# Patient Record
Sex: Female | Born: 1945 | Race: Black or African American | Hispanic: No | Marital: Single | Smoking: Former smoker
Health system: Southern US, Community
[De-identification: ages and names within clinical notes are randomized; demographics above are authoritative.]

## PROBLEM LIST (undated history)

## (undated) DIAGNOSIS — Z9289 Personal history of other medical treatment: Secondary | ICD-10-CM

## (undated) DIAGNOSIS — M1990 Unspecified osteoarthritis, unspecified site: Secondary | ICD-10-CM

## (undated) DIAGNOSIS — C801 Malignant (primary) neoplasm, unspecified: Secondary | ICD-10-CM

## (undated) DIAGNOSIS — G4700 Insomnia, unspecified: Secondary | ICD-10-CM

## (undated) DIAGNOSIS — M810 Age-related osteoporosis without current pathological fracture: Secondary | ICD-10-CM

## (undated) DIAGNOSIS — E039 Hypothyroidism, unspecified: Secondary | ICD-10-CM

## (undated) DIAGNOSIS — M549 Dorsalgia, unspecified: Secondary | ICD-10-CM

## (undated) DIAGNOSIS — I8390 Asymptomatic varicose veins of unspecified lower extremity: Secondary | ICD-10-CM

## (undated) DIAGNOSIS — K219 Gastro-esophageal reflux disease without esophagitis: Secondary | ICD-10-CM

## (undated) DIAGNOSIS — K5900 Constipation, unspecified: Secondary | ICD-10-CM

## (undated) DIAGNOSIS — E079 Disorder of thyroid, unspecified: Secondary | ICD-10-CM

## (undated) DIAGNOSIS — R232 Flushing: Secondary | ICD-10-CM

## (undated) HISTORY — PX: ABDOMINAL HYSTERECTOMY: SHX81

## (undated) HISTORY — DX: Disorder of thyroid, unspecified: E07.9

## (undated) HISTORY — DX: Age-related osteoporosis without current pathological fracture: M81.0

## (undated) HISTORY — DX: Flushing: R23.2

## (undated) HISTORY — PX: KNEE ARTHROSCOPY: SUR90

## (undated) HISTORY — PX: BREAST SURGERY: SHX581

## (undated) HISTORY — DX: Asymptomatic varicose veins of unspecified lower extremity: I83.90

## (undated) HISTORY — DX: Dorsalgia, unspecified: M54.9

## (undated) HISTORY — PX: BUNIONECTOMY: SHX129

## (undated) HISTORY — DX: Unspecified osteoarthritis, unspecified site: M19.90

## (undated) HISTORY — DX: Malignant (primary) neoplasm, unspecified: C80.1

## (undated) HISTORY — PX: VARICOSE VEIN SURGERY: SHX832

## (undated) HISTORY — PX: TUBAL LIGATION: SHX77

---

## 1978-10-16 DIAGNOSIS — Z862 Personal history of diseases of the blood and blood-forming organs and certain disorders involving the immune mechanism: Secondary | ICD-10-CM | POA: Insufficient documentation

## 1978-10-16 DIAGNOSIS — Z8639 Personal history of other endocrine, nutritional and metabolic disease: Secondary | ICD-10-CM | POA: Insufficient documentation

## 1988-10-16 DIAGNOSIS — E018 Other iodine-deficiency related thyroid disorders and allied conditions: Secondary | ICD-10-CM | POA: Insufficient documentation

## 2000-07-05 ENCOUNTER — Encounter: Admission: RE | Admit: 2000-07-05 | Discharge: 2000-07-05 | Payer: Self-pay | Admitting: Internal Medicine

## 2000-07-05 ENCOUNTER — Encounter: Payer: Self-pay | Admitting: Internal Medicine

## 2002-06-30 ENCOUNTER — Other Ambulatory Visit: Admission: RE | Admit: 2002-06-30 | Discharge: 2002-06-30 | Payer: Self-pay | Admitting: Obstetrics and Gynecology

## 2003-06-27 ENCOUNTER — Emergency Department (HOSPITAL_COMMUNITY): Admission: EM | Admit: 2003-06-27 | Discharge: 2003-06-27 | Payer: Self-pay | Admitting: Emergency Medicine

## 2003-08-11 ENCOUNTER — Other Ambulatory Visit: Admission: RE | Admit: 2003-08-11 | Discharge: 2003-08-11 | Payer: Self-pay | Admitting: Internal Medicine

## 2004-10-20 ENCOUNTER — Other Ambulatory Visit: Admission: RE | Admit: 2004-10-20 | Discharge: 2004-10-20 | Payer: Self-pay | Admitting: Internal Medicine

## 2007-06-14 ENCOUNTER — Other Ambulatory Visit: Admission: RE | Admit: 2007-06-14 | Discharge: 2007-06-14 | Payer: Self-pay | Admitting: Internal Medicine

## 2008-10-19 ENCOUNTER — Ambulatory Visit: Payer: Self-pay | Admitting: Internal Medicine

## 2008-10-29 ENCOUNTER — Ambulatory Visit: Payer: Self-pay | Admitting: Internal Medicine

## 2008-10-29 DIAGNOSIS — M545 Low back pain: Secondary | ICD-10-CM

## 2008-10-29 LAB — CONVERTED CEMR LAB: TSH: 1.18 microintl units/mL (ref 0.350–4.50)

## 2008-11-08 ENCOUNTER — Encounter (INDEPENDENT_AMBULATORY_CARE_PROVIDER_SITE_OTHER): Payer: Self-pay | Admitting: Internal Medicine

## 2009-02-26 ENCOUNTER — Ambulatory Visit: Payer: Self-pay | Admitting: Internal Medicine

## 2009-02-26 DIAGNOSIS — M25569 Pain in unspecified knee: Secondary | ICD-10-CM | POA: Insufficient documentation

## 2009-02-26 DIAGNOSIS — M75101 Unspecified rotator cuff tear or rupture of right shoulder, not specified as traumatic: Secondary | ICD-10-CM | POA: Insufficient documentation

## 2009-02-26 LAB — CONVERTED CEMR LAB
ALT: 16 units/L (ref 0–35)
AST: 25 units/L (ref 0–37)
Albumin: 4.1 g/dL (ref 3.5–5.2)
Alkaline Phosphatase: 123 units/L — ABNORMAL HIGH (ref 39–117)
BUN: 8 mg/dL (ref 6–23)
Basophils Absolute: 0 10*3/uL (ref 0.0–0.1)
Basophils Relative: 1 % (ref 0–1)
Bilirubin Urine: NEGATIVE
Blood in Urine, dipstick: NEGATIVE
CO2: 26 meq/L (ref 19–32)
Calcium: 9.1 mg/dL (ref 8.4–10.5)
Chloride: 103 meq/L (ref 96–112)
Cholesterol: 142 mg/dL (ref 0–200)
Creatinine, Ser: 0.76 mg/dL (ref 0.40–1.20)
Eosinophils Absolute: 0.1 10*3/uL (ref 0.0–0.7)
Eosinophils Relative: 2 % (ref 0–5)
Glucose, Bld: 75 mg/dL (ref 70–99)
Glucose, Urine, Semiquant: NEGATIVE
HCT: 41 % (ref 36.0–46.0)
HDL: 44 mg/dL (ref >39)
Hemoglobin: 13.6 g/dL (ref 12.0–15.0)
Ketones, urine, test strip: NEGATIVE
LDL Cholesterol: 82 mg/dL (ref 0–99)
Lymphocytes Relative: 36 % (ref 12–46)
Lymphs Abs: 1.7 10*3/uL (ref 0.7–4.0)
MCHC: 33.2 g/dL (ref 30.0–36.0)
MCV: 87.6 fL (ref 78.0–100.0)
Monocytes Absolute: 0.4 10*3/uL (ref 0.1–1.0)
Monocytes Relative: 9 % (ref 3–12)
Neutro Abs: 2.5 10*3/uL (ref 1.7–7.7)
Neutrophils Relative %: 53 % (ref 43–77)
Nitrite: NEGATIVE
Platelets: 221 10*3/uL (ref 150–400)
Potassium: 4.2 meq/L (ref 3.5–5.3)
Protein, U semiquant: NEGATIVE
RBC: 4.68 M/uL (ref 3.87–5.11)
RDW: 14.3 % (ref 11.5–15.5)
Sodium: 141 meq/L (ref 135–145)
Specific Gravity, Urine: 1.015
Total Bilirubin: 0.6 mg/dL (ref 0.3–1.2)
Total CHOL/HDL Ratio: 3.2
Total Protein: 6.9 g/dL (ref 6.0–8.3)
Triglycerides: 80 mg/dL (ref <150)
Urobilinogen, UA: 0.2
VLDL: 16 mg/dL (ref 0–40)
WBC Urine, dipstick: NEGATIVE
WBC: 4.6 10*3/uL (ref 4.0–10.5)
pH: 7

## 2009-02-28 ENCOUNTER — Encounter (INDEPENDENT_AMBULATORY_CARE_PROVIDER_SITE_OTHER): Payer: Self-pay | Admitting: Internal Medicine

## 2012-11-11 ENCOUNTER — Other Ambulatory Visit: Payer: Self-pay | Admitting: General Practice

## 2012-11-11 DIAGNOSIS — N6310 Unspecified lump in the right breast, unspecified quadrant: Secondary | ICD-10-CM

## 2012-11-21 ENCOUNTER — Ambulatory Visit
Admission: RE | Admit: 2012-11-21 | Discharge: 2012-11-21 | Disposition: A | Discharge status: Home / Self Care | Payer: Medicaid Other | Source: Ambulatory Visit | Attending: General Practice | Admitting: General Practice

## 2012-11-21 ENCOUNTER — Other Ambulatory Visit: Payer: Self-pay | Admitting: General Practice

## 2012-11-21 DIAGNOSIS — N6310 Unspecified lump in the right breast, unspecified quadrant: Secondary | ICD-10-CM

## 2012-11-25 ENCOUNTER — Other Ambulatory Visit: Payer: Self-pay | Admitting: General Practice

## 2012-11-25 ENCOUNTER — Ambulatory Visit
Admission: RE | Admit: 2012-11-25 | Discharge: 2012-11-25 | Disposition: A | Discharge status: Home / Self Care | Payer: Medicaid Other | Source: Ambulatory Visit | Attending: General Practice | Admitting: General Practice

## 2012-11-25 ENCOUNTER — Inpatient Hospital Stay: Admission: RE | Admit: 2012-11-25 | Payer: Medicaid Other | Source: Ambulatory Visit

## 2012-11-25 DIAGNOSIS — N6310 Unspecified lump in the right breast, unspecified quadrant: Secondary | ICD-10-CM

## 2012-11-26 ENCOUNTER — Telehealth: Payer: Self-pay | Admitting: *Deleted

## 2012-11-26 ENCOUNTER — Other Ambulatory Visit: Payer: Self-pay | Admitting: General Practice

## 2012-11-26 DIAGNOSIS — C50419 Malignant neoplasm of upper-outer quadrant of unspecified female breast: Secondary | ICD-10-CM | POA: Insufficient documentation

## 2012-11-26 DIAGNOSIS — C50411 Malignant neoplasm of upper-outer quadrant of right female breast: Secondary | ICD-10-CM

## 2012-11-26 DIAGNOSIS — C50911 Malignant neoplasm of unspecified site of right female breast: Secondary | ICD-10-CM

## 2012-11-26 NOTE — Telephone Encounter (Signed)
Confirmed [facility] for 12/04/12 at 0800 .  Instructions and contact information given. 

## 2012-11-29 ENCOUNTER — Other Ambulatory Visit: Payer: Medicaid Other

## 2012-12-04 ENCOUNTER — Telehealth: Payer: Self-pay | Admitting: Oncology

## 2012-12-04 ENCOUNTER — Ambulatory Visit (HOSPITAL_BASED_OUTPATIENT_CLINIC_OR_DEPARTMENT_OTHER): Payer: Medicaid Other | Admitting: General Surgery

## 2012-12-04 ENCOUNTER — Ambulatory Visit: Payer: Medicaid Other

## 2012-12-04 ENCOUNTER — Encounter (INDEPENDENT_AMBULATORY_CARE_PROVIDER_SITE_OTHER): Payer: Self-pay | Admitting: General Surgery

## 2012-12-04 ENCOUNTER — Ambulatory Visit
Admission: RE | Admit: 2012-12-04 | Discharge: 2012-12-04 | Disposition: A | Discharge status: Home / Self Care | Payer: Medicaid Other | Source: Ambulatory Visit | Attending: Radiation Oncology | Admitting: Radiation Oncology

## 2012-12-04 ENCOUNTER — Encounter: Payer: Self-pay | Admitting: Oncology

## 2012-12-04 ENCOUNTER — Ambulatory Visit: Payer: Medicaid Other | Attending: General Surgery | Admitting: Physical Therapy

## 2012-12-04 ENCOUNTER — Other Ambulatory Visit: Payer: Medicaid Other | Admitting: Lab

## 2012-12-04 ENCOUNTER — Ambulatory Visit (HOSPITAL_BASED_OUTPATIENT_CLINIC_OR_DEPARTMENT_OTHER): Payer: Medicaid Other | Admitting: Oncology

## 2012-12-04 VITALS — BP 136/84 | HR 92 | Temp 98.4°F | Resp 20 | Ht 65.5 in | Wt 213.3 lb

## 2012-12-04 DIAGNOSIS — M25619 Stiffness of unspecified shoulder, not elsewhere classified: Secondary | ICD-10-CM | POA: Insufficient documentation

## 2012-12-04 DIAGNOSIS — Z170 Estrogen receptor positive status [ER+]: Secondary | ICD-10-CM

## 2012-12-04 DIAGNOSIS — R293 Abnormal posture: Secondary | ICD-10-CM | POA: Insufficient documentation

## 2012-12-04 DIAGNOSIS — C50919 Malignant neoplasm of unspecified site of unspecified female breast: Secondary | ICD-10-CM | POA: Insufficient documentation

## 2012-12-04 DIAGNOSIS — D0590 Unspecified type of carcinoma in situ of unspecified breast: Secondary | ICD-10-CM

## 2012-12-04 DIAGNOSIS — IMO0001 Reserved for inherently not codable concepts without codable children: Secondary | ICD-10-CM | POA: Insufficient documentation

## 2012-12-04 LAB — COMPREHENSIVE METABOLIC PANEL (CC13)
Alkaline Phosphatase: 147 U/L (ref 40–150)
BUN: 11.6 mg/dL (ref 7.0–26.0)
CO2: 27 mEq/L (ref 22–29)
Creatinine: 0.8 mg/dL (ref 0.6–1.1)
Glucose: 99 mg/dl (ref 70–99)
Sodium: 143 mEq/L (ref 136–145)
Total Bilirubin: 0.43 mg/dL (ref 0.20–1.20)
Total Protein: 7.4 g/dL (ref 6.4–8.3)

## 2012-12-04 LAB — CBC WITH DIFFERENTIAL/PLATELET
Basophils Absolute: 0.1 10*3/uL (ref 0.0–0.1)
Eosinophils Absolute: 0.1 10*3/uL (ref 0.0–0.5)
HCT: 42.4 % (ref 34.8–46.6)
HGB: 14.3 g/dL (ref 11.6–15.9)
LYMPH%: 31.7 % (ref 14.0–49.7)
MCV: 90.6 fL (ref 79.5–101.0)
MONO#: 0.6 10*3/uL (ref 0.1–0.9)
MONO%: 11.1 % (ref 0.0–14.0)
NEUT#: 3 10*3/uL (ref 1.5–6.5)
NEUT%: 53.7 % (ref 38.4–76.8)
Platelets: 175 10*3/uL (ref 145–400)
WBC: 5.6 10*3/uL (ref 3.9–10.3)

## 2012-12-04 NOTE — Progress Notes (Signed)
Patient ID: Lauren Farley, female   DOB: April 23, 1946, 67 y.o.   MRN: [id number]  No chief complaint on file.   HPI Lauren Farley is a 67 y.o. female.   HPI  She is referred by Dr. Judyann Munson for newly diagnosed DCIS of right breast.  Approximately 2 months ago, she noted a lump in the right breast underneath the nipple at the 9:00 position. A mammogram was performed which demonstrated the lesion. An image guided biopsy was done. 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 It was hormone receptor positive. Proliferation marker was 6%.  No family history of breast cancer. She is unsure of the age at menarche. No hormone replacement therapy. She had a hysterectomy in the 1980s. She's had no pain from the mass. No nipple discharge.  Past Medical History  Diagnosis Date  . Arthritis   . Back pain   . Hot flashes   . Cancer     Breast  . Thyroid disease     Graves disease s/p RAI treatment    Past Surgical History  Procedure Laterality Date  . Bunionectomy Bilateral   . Abdominal hysterectomy      TAH/BSO    History reviewed. No pertinent family history.  Social History History  Substance Use Topics  . Smoking status: Former Games developer  . Smokeless tobacco: Not on file  . Alcohol Use: Yes    Not on File  No current outpatient prescriptions on file.   No current facility-administered medications for this visit.    Review of Systems Review of Systems  Constitutional: Positive for fatigue.  HENT: Negative.   Eyes: Negative.   Respiratory: Negative.   Cardiovascular: Negative.   Gastrointestinal: Negative.   Genitourinary: Negative.   Musculoskeletal: Positive for back pain.  Skin: Negative.   Hematological: Negative.     There were no vitals taken for this visit.  Physical Exam Physical Exam  Constitutional:  Obese female.  HENT:  Head: Normocephalic and atraumatic.  Eyes:  EOM are normal. No scleral icterus.  Left exophthalmos  Neck: Neck supple.  Cardiovascular: Normal rate and regular rhythm.   Pulmonary/Chest: Effort normal and breath sounds normal.  Right breast-palpable 2-2.5 cm mass at the 9:00 position of the nipple areolar complex.  Left breast-no palpable mass or suspicious skin change.  No palpable axillary or supraclavicular adenopathy  Abdominal: Soft. She exhibits no mass. There is no tenderness.  Obese. Lower midline scar.  Musculoskeletal: Normal range of motion. She exhibits no edema.  Lymphadenopathy:    She has no cervical adenopathy.  Neurological: She is alert.  Skin: Skin is warm and dry.    Data Reviewed Mammogram. Pathology report.  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 We discussed breast conservation therapy versus mastectomy. She wants bilateral mastectomies. She does not want reconstruction.    Plan    Cancel MRI. We'll plan on bilateral mastectomies with right axillary sentinel lymph node biopsy.  I have explained the procedure, risks, and aftercare to her.  Risks include but are not limited to bleeding, infection, wound problems, anesthesia, chronic chest wall pain, nerve injury, seroma formation, lymphedema.  She seems to understand and agrees with the plan.        Edmond Ginsberg J 12/04/2012, 11:26 AM

## 2012-12-04 NOTE — Progress Notes (Signed)
Lauren Farley [id number] Nov 20, 1945 67 y.o. 12/04/2012 10:57 AM  CC  Lauren Manson, MD [address] [place] [address] Dr. Teresa Coombs Dr. Lurline Hare  REASON FOR CONSULTATION:  67 year old female with new diagnosis of small DCIS with possible microinvasion of the right breast. Patient was seen in the Multidisciplinary Breast Clinic for discussion of her treatment options.  STAGE:   Cancer of upper-outer quadrant of female breast   Primary site: Breast (Right)   Staging method: AJCC 7th Edition   Clinical: Stage 0 (Tis (DCIS), N0, cM0)   Summary: Stage 0 (Tis (DCIS), N0, cM0)  REFERRING PHYSICIAN: Dr. Avel Peace  HISTORY OF PRESENT ILLNESS:  Lauren Farley is a 67 y.o. female.  With newly diagnosed DCIS of the right breast. About 2 months ago patient noted a mass in the right breast underneath the nipple at the 9:00 position. Mammogram demonstrated the lesion. 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 The tumor was ER positive PR positive with a proliferation marker Ki-67 6%. Patient's case was discussed in the multidisciplinary breast conference. Her radiology and pathology were reviewed.   Past Medical History: Past Medical History  Diagnosis Date  . Arthritis   . Back pain   . Hot flashes   . Cancer     Breast  . Thyroid disease     Graves disease s/p RAI treatment    Past Surgical History: Past Surgical History  Procedure Laterality Date  . Bunionectomy Bilateral   . Abdominal hysterectomy      TAH/BSO    Family History: No family history on file.  Social History History  Substance Use Topics  . Smoking status: Former Games developer  . Smokeless tobacco: Not on file  . Alcohol Use: Yes    Allergies: Not on File  Current Medications: No current outpatient prescriptions on file.   No current  facility-administered medications for this visit.    OB/GYN History:patient had menarche many years ago she underwent menopause in the 61s she had been on hormone replacement therapy only for about 2 months. She's had 4 term pregnancies first live birth at 49.  Fertility Discussion:not applicable Prior History of Cancer:no  Health Maintenance:  Colonoscopy no Bone Density 2005 Last PAP smear unknown  ECOG PERFORMANCE STATUS: 0 - Asymptomatic  Genetic Counseling/testing:not recommended at this time  REVIEW OF SYSTEMS:  A comprehensive review of systems was negative.  PHYSICAL EXAMINATION: Blood pressure 136/84, pulse 92, temperature 98.4 F (36.9 C), temperature source Oral, resp. rate 20, height 5' 5.5" (1.664 m), weight 213 lb 4.8 oz (96.752 kg).  XBJ:YNWGN, healthy, no distress, well nourished and well developed SKIN: skin color, texture, turgor are normal HEAD: Normocephalic EYES: PERRLA, EOMI EARS: External ears normal OROPHARYNX:no exudate and no erythema  NECK: supple, no adenopathy LYMPH:  no palpable lymphadenopathy, no hepatosplenomegaly BREAST:left breast normal without mass, skin or nipple changes or axillary nodes, abnormal mass palpable very small area around LUNGS: clear to auscultation  HEART: regular rate & rhythm ABDOMEN:abdomen soft, non-tender, normal bowel sounds and no masses or organomegaly BACK: No CVA tenderness EXTREMITIES:no edema  NEURO: alert & oriented x 3 with fluent speech, no focal motor/sensory deficits, gait normal     STUDIES/RESULTS: US Breast Right  11/21/2012  *RADIOLOGY REPORT*  Clinical Data:  Palpable right breast abnormality.  DIGITAL DIAGNOSTIC BILATERAL MAMMOGRAM WITH CAD AND RIGHT BREAST ULTRASOUND:  Comparison:  Prior mammograms dated 05/17/2007 and 08/18/2004 from [facility]  Findings:  ACR Breast Density Category 2: There is a scattered fibroglandular pattern.  There is a developing 11 mm mass in the lateral  aspect of the right breast in the anterior third.  There is a stable, well- circumscribed lesion and an adjacent lymph node in the middle third of the upper outer quadrant of the right breast.  The left breast is negative.  Mammographic images were processed with CAD.  On physical exam, I palpate a discrete mass in the right breast at 9 o'clock 1 cm from the nipple.  Ultrasound is performed, showing there is a hypoechoic mass in the right breast at 9 o'clock 1 cm from the nipple measuring 1.1 x 0.7 x 1.0 cm.  It appears to have ductal extension.  It is concerning. Adjacent to this is a smaller near anechoic lesion measuring 3 x 3 x 3 mm likely a cyst.  There is an ovoid well-circumscribed mass in the right breast at 9 o'clock 8 cm from the nipple measuring 1.0 x 0.6 x 1.2 cm consistent with the benign appearing lesion on the mammogram.  At 9 o'clock 7 cm from the nipple there is an intramammary lymph node measuring 0.6 x 0.3 x 0.7 cm. This is stable mammographically.  IMPRESSION: Suspicious mass in the 9 o'clock region of the right breast 1 cm from the nipple.  RECOMMENDATION: Ultrasound guided core biopsy of the right breast will be performed and dictated separately.  I have discussed the findings and recommendations with the patient. Results were also provided in writing at the conclusion of the visit.  BI-RADS CATEGORY 4:  Suspicious abnormality - biopsy should be considered.   Original Report Authenticated By: Baird Lyons, M.D.    Mm Digital Diagnostic Bilat  11/21/2012  *RADIOLOGY REPORT*  Clinical Data:  Palpable right breast abnormality.  DIGITAL DIAGNOSTIC BILATERAL MAMMOGRAM WITH CAD AND RIGHT BREAST ULTRASOUND:  Comparison:  Prior mammograms dated 05/17/2007 and 08/18/2004 from [facility]  Findings:  ACR Breast Density Category 2: There is a scattered fibroglandular pattern.  There is a developing 11 mm mass in the lateral aspect of the right breast in the anterior third.  There is a stable,  well- circumscribed lesion and an adjacent lymph node in the middle third of the upper outer quadrant of the right breast.  The left breast is negative.  Mammographic images were processed with CAD.  On physical exam, I palpate a discrete mass in the right breast at 9 o'clock 1 cm from the nipple.  Ultrasound is performed, showing there is a hypoechoic mass in the right breast at 9 o'clock 1 cm from the nipple measuring 1.1 x 0.7 x 1.0 cm.  It appears to have ductal extension.  It is concerning. Adjacent to this is a smaller near anechoic lesion measuring 3 x 3 x 3 mm likely a cyst.  There is an ovoid well-circumscribed mass in the right breast at 9 o'clock 8 cm from the nipple measuring 1.0 x 0.6 x 1.2 cm consistent with the benign appearing lesion on the mammogram.  At 9 o'clock 7 cm from the nipple there is an intramammary lymph node measuring 0.6 x 0.3 x 0.7 cm. This is stable mammographically.  IMPRESSION: Suspicious mass in the 9 o'clock region of the right breast 1 cm from the nipple.  RECOMMENDATION: Ultrasound guided core biopsy of the right breast will be performed and dictated separately.  I have discussed  the findings and recommendations with the patient. Results were also provided in writing at the conclusion of the visit.  BI-RADS CATEGORY 4:  Suspicious abnormality - biopsy should be considered.   Original Report Authenticated By: Baird Lyons, M.D.    Mm Radiologist Eval And Mgmt  11/25/2012  *RADIOLOGY REPORT*  ESTABLISHED PATIENT OFFICE VISIT - LEVEL II [phone])  Chief Complaint:  The patient presented a palpable abnormality in the right breast.  Ultrasound-guided core biopsy was performed. The patient returns for wound site check and results.  History:  Status post ultrasound-guided core biopsy of the right breast.  Exam:  The wound site is clean and dry with no signs of hematoma or infection.  There is some ecchymosis at the biopsy site.  Pathology: Ductal carcinoma with papillary features was  reported histologically.  This corresponds well with the imaging findings. The patient was made aware of the findings.  Assessment and Plan:  The patient is scheduled to be seen at the Multidisciplinary Clinic on 12/04/2012.  Breast MRI will be arranged at the patient's convenience.   Original Report Authenticated By: Baird Lyons, M.D.    [place] Rt Breast Bx W Loc Dev 1st Lesion Img Bx Spec US Guide  11/21/2012  *RADIOLOGY REPORT*  Clinical Data:  Suspicious right breast mass  ULTRASOUND GUIDED VACUUM ASSISTED CORE BIOPSY OF THE RIGHT BREAST  Comparison: Previous exams.  I met with the patient and we discussed the procedure of ultrasound- guided biopsy, including benefits and alternatives.  We discussed the high likelihood of a successful procedure. We discussed the risks of the procedure including infection, bleeding, tissue injury, clip migration, and inadequate sampling.  Informed written consent was given.  Using sterile technique, 2% lidocaine ultrasound guidance and a 12 gauge vacuum assisted needle biopsy was performed of a mass in the 9 o'clock region of the right breast 1 cm from the nipple usingan inferior approach.  I discussed the importance of clip placement following a biopsy.  The patient refused a marker clip, therefore not deployed.  IMPRESSION: Ultrasound-guided biopsy of the right breast.  No apparent complications.   Original Report Authenticated By: Baird Lyons, M.D.      LABS:    Chemistry      Component Value Date/Time   NA 143 12/04/2012 0826   NA 141 02/26/2009 2217   K 4.1 12/04/2012 0826   K 4.2 02/26/2009 2217   CL 106 12/04/2012 0826   CL 103 02/26/2009 2217   CO2 27 12/04/2012 0826   CO2 26 02/26/2009 2217   BUN 11.6 12/04/2012 0826   BUN 8 02/26/2009 2217   CREATININE 0.8 12/04/2012 0826   CREATININE 0.76 02/26/2009 2217      Component Value Date/Time   CALCIUM 9.8 12/04/2012 0826   CALCIUM 9.1 02/26/2009 2217   ALKPHOS 147 12/04/2012 0826   ALKPHOS 123* 02/26/2009 2217   AST 16  12/04/2012 0826   AST 25 02/26/2009 2217   ALT 16 12/04/2012 0826   ALT 16 02/26/2009 2217   BILITOT 0.43 12/04/2012 0826   BILITOT 0.6 02/26/2009 2217      Lab Results  Component Value Date   WBC 5.6 12/04/2012   HGB 14.3 12/04/2012   HCT 42.4 12/04/2012   MCV 90.6 12/04/2012   PLT 175 12/04/2012   PATHOLOGY: ADDITIONAL INFORMATION: PROGNOSTIC INDICATORS - ACIS Results IMMUNOHISTOCHEMICAL AND MORPHOMETRIC ANALYSIS BY THE AUTOMATED CELLULAR IMAGING SYSTEM (ACIS) Estrogen Receptor (Negative, <1%): 100%, STRONG STAINING INTENSITY Progesterone Receptor (Negative, <1%): 100%, STRONG  STAINING INTENSITY Proliferation Marker Ki67 by M IB-1 (Low<20%): 6% All controls stained appropriately Lauren Leisure MD Pathologist, Electronic Signature ( Signed 11/27/2012) CHROMOGENIC IN-SITU HYBRIDIZATION Interpretation HER-2/NEU BY CISH - NO AMPLIFICATION OF HER-2 DETECTED. THE RATIO OF HER-2: CEP 17 SIGNALS WAS 1.37. Reference range: Ratio: HER2:CEP17 < 1.8 - gene amplification not observed Ratio: HER2:CEP 17 1.8-2.2 - equivocal result Ratio: HER2:CEP17 > 2.2 - gene amplification observed Lauren Leisure MD Pathologist, Electronic Signature ( Signed 11/27/2012) FINAL DIAGNOSIS 1 of 3 FINAL for Lauren Farley, Lauren ([id number]) Diagnosis Breast, right, needle core biopsy, 9 o'clock - DUCTAL CARCINOMA WITH PAPILLARY FEATURES. SEE COMMENT. Microscopic Comment The lack of p63 staining raises the possibility of a broadly invasive ductal carcinoma with papillary features. Dr. Colonel Bald has reviewed this case and agrees. Call to [facility] on 11/22/12 and 11/25/12. Breast prognostic profile will be performed. (JDP:eps 11/25/12) Lauren Picket MD Pathologist, Electronic Signature (Case signed 11/25/2012) Specimen Gross and Clinical Information ASSESSMENT    67 year old female with new diagnosis of ductal carcinoma in situ of the right breast found on self breast examination. The tumor is ER positive PR  positive. She is clinical stage 0. Patient has opted for bilateral mastectomies without reconstruction. We discussed extensively the possible role of lumpectomy however she did not want to hear this. She is very determined to have mastectomies performed that she does not want to have to deal with this disease again. She did meet with Dr. Abbey Chatters  as well as Dr. Michell Heinrich.  Clinical Trial Eligibility: no Multidisciplinary conference discussion yes     PLAN:    Patient will be called by Dr. Maurie Boettcher office regarding surgical date and instructions. I will plan on seeing her back after the surgery to discuss the possible role of antiestrogen therapy.        Discussion: Patient is being treated per NCCN breast cancer care guidelines appropriate for stage.0, however the patient has opted for bilateral mastectomies.   Thank you so much for allowing me to participate in the care of Lauren Farley. I will continue to follow up the patient with you and assist in her care.  All questions were answered. The patient knows to call the clinic with any problems, questions or concerns. We can certainly see the patient much sooner if necessary.  I spent 55 minutes counseling the patient face to face. The total time spent in the appointment was 55 minutes.   Drue Second, MD Medical/Oncology [facility] [phone] (beeper) [phone] (Office)  12/04/2012, 10:57 AM

## 2012-12-04 NOTE — Progress Notes (Signed)
Lauren Farley has chosen bilateral mastectomies. She has a small area of DCIS with possible microinvasion.  She will likely be cured with surgery alone. I do not see a role for adjuvant radiation. I will be happy to see her back if she has tumor size greater than 5 cm or positive lymph nodes.

## 2012-12-04 NOTE — Telephone Encounter (Signed)
gv pt appt schedule for May. °

## 2012-12-04 NOTE — Patient Instructions (Signed)
My office will call you to schedule the surgery. 

## 2012-12-04 NOTE — Patient Instructions (Addendum)
Proceed with bilateral mastectomy per patient wishes

## 2012-12-04 NOTE — Progress Notes (Signed)
Checked in new patient. No financial issues. She did have her [facility] packet.

## 2012-12-06 ENCOUNTER — Other Ambulatory Visit: Payer: Medicaid Other

## 2012-12-09 ENCOUNTER — Encounter: Payer: Self-pay | Admitting: *Deleted

## 2012-12-09 ENCOUNTER — Telehealth: Payer: Self-pay | Admitting: *Deleted

## 2012-12-09 NOTE — Telephone Encounter (Signed)
Spoke to pt concerning [facility] from 12/04/12.  Pt denies questions or concerns regarding dx or treatment care plan.  Confirmed surgery date and f/u appt with Dr. Welton Flakes.  Encourage pt to call with needs. Received verbal understanding.  Contact information given.

## 2012-12-09 NOTE — Progress Notes (Signed)
[facility] Psychosocial Distress Screening Clinical Social Work  Patient completed distress screening protocol, and scored a 5 on the Psychosocial Distress Thermometer which indicates mild distress. Clinical Social Worker met with patient [facility] (12/04/12) to assess for distress and other psychosocial needs.  Pt stated she was doing well and had no urgent concerns at this time.  CSW provided pt with information on the support team and support services at [facility].  CSW encouraged pt to call with any questions or concerns.    Tamala Julian, MSW, LCSW Clinical Social Worker [facility] [phone]

## 2012-12-10 ENCOUNTER — Encounter (HOSPITAL_COMMUNITY): Payer: Self-pay | Admitting: Pharmacy Technician

## 2012-12-16 NOTE — Pre-Procedure Instructions (Signed)
Molli Gethers  12/16/2012   Your procedure is scheduled on:  Tuesday , March 11th.  Report to [facility] at 5:30 AM.  Call this number if you have problems the morning of surgery: [phone]   Remember:   Do not eat food or drink liquids after midnight.   Take these medicines the morning of surgery with A SIP OF WATER: Levothyroxine (Synthyroid).   Do not wear jewelry, make-up or nail polish.  Do not wear lotions, powders, or perfumes. You may wear deodorant.  Do not shave 48 hours prior to surgery.   Do not bring valuables to the hospital.  Contacts, dentures or bridgework may not be worn into surgery.  Leave suitcase in the car. After surgery it may be brought to your room.  For patients admitted to the hospital, checkout time is 11:00 AM the day of discharge.   Patients discharged the day of surgery will not be allowed to drive home.  Name and phone number of your driver: *-   Special Instructions: Shower using CHG 2 nights before surgery and the night before surgery.  If you shower the day of surgery use CHG.  Use special wash - you have one bottle of CHG for all showers.  You should use approximately 1/3 of the bottle for each shower.   Please read over the following fact sheets that you were given: Pain Booklet, Coughing and Deep Breathing and Surgical Site Infection Prevention

## 2012-12-17 ENCOUNTER — Encounter (HOSPITAL_COMMUNITY)
Admission: RE | Admit: 2012-12-17 | Discharge: 2012-12-17 | Disposition: A | Discharge status: Home / Self Care | Payer: Medicaid Other | Source: Ambulatory Visit | Attending: General Surgery | Admitting: General Surgery

## 2012-12-17 ENCOUNTER — Encounter (HOSPITAL_COMMUNITY): Payer: Self-pay

## 2012-12-17 HISTORY — DX: Insomnia, unspecified: G47.00

## 2012-12-17 HISTORY — DX: Constipation, unspecified: K59.00

## 2012-12-17 HISTORY — DX: Gastro-esophageal reflux disease without esophagitis: K21.9

## 2012-12-17 HISTORY — DX: Personal history of other medical treatment: Z92.89

## 2012-12-17 HISTORY — DX: Hypothyroidism, unspecified: E03.9

## 2012-12-17 LAB — CBC
HCT: 39.8 % (ref 36.0–46.0)
Hemoglobin: 14.2 g/dL (ref 12.0–15.0)
MCH: 31.3 pg (ref 26.0–34.0)
MCV: 87.7 fL (ref 78.0–100.0)
RBC: 4.54 MIL/uL (ref 3.87–5.11)

## 2012-12-17 LAB — PROTIME-INR: INR: 1 (ref 0.00–1.49)

## 2012-12-23 MED ORDER — CEFAZOLIN SODIUM-DEXTROSE 2-3 GM-% IV SOLR
2.0000 g | INTRAVENOUS | Status: AC
Start: 2012-12-24 — End: 2012-12-24
  Administered 2012-12-24: 2 g via INTRAVENOUS
  Filled 2012-12-23: qty 50

## 2012-12-24 ENCOUNTER — Ambulatory Visit (HOSPITAL_COMMUNITY): Payer: Medicaid Other | Admitting: Anesthesiology

## 2012-12-24 ENCOUNTER — Encounter (HOSPITAL_COMMUNITY): Payer: Self-pay | Admitting: Anesthesiology

## 2012-12-24 ENCOUNTER — Encounter (HOSPITAL_COMMUNITY)
Admission: RE | Disposition: A | Discharge status: Home / Self Care | Payer: Self-pay | Source: Ambulatory Visit | Attending: General Surgery

## 2012-12-24 ENCOUNTER — Encounter (HOSPITAL_COMMUNITY)
Admission: RE | Admit: 2012-12-24 | Discharge: 2012-12-24 | Disposition: A | Discharge status: Home / Self Care | Payer: Medicaid Other | Source: Ambulatory Visit | Attending: General Surgery | Admitting: General Surgery

## 2012-12-24 ENCOUNTER — Observation Stay (HOSPITAL_COMMUNITY)
Admission: RE | Admit: 2012-12-24 | Discharge: 2012-12-25 | Disposition: A | Discharge status: Home / Self Care | Payer: Medicaid Other | Source: Ambulatory Visit | Attending: General Surgery | Admitting: General Surgery

## 2012-12-24 ENCOUNTER — Encounter (HOSPITAL_COMMUNITY): Payer: Self-pay | Admitting: General Practice

## 2012-12-24 DIAGNOSIS — Z01812 Encounter for preprocedural laboratory examination: Secondary | ICD-10-CM | POA: Insufficient documentation

## 2012-12-24 DIAGNOSIS — D0590 Unspecified type of carcinoma in situ of unspecified breast: Principal | ICD-10-CM | POA: Insufficient documentation

## 2012-12-24 DIAGNOSIS — C50419 Malignant neoplasm of upper-outer quadrant of unspecified female breast: Secondary | ICD-10-CM | POA: Insufficient documentation

## 2012-12-24 DIAGNOSIS — D249 Benign neoplasm of unspecified breast: Secondary | ICD-10-CM | POA: Insufficient documentation

## 2012-12-24 DIAGNOSIS — C50919 Malignant neoplasm of unspecified site of unspecified female breast: Secondary | ICD-10-CM | POA: Insufficient documentation

## 2012-12-24 HISTORY — PX: MASTECTOMY MODIFIED RADICAL: SUR848

## 2012-12-24 HISTORY — PX: SIMPLE MASTECTOMY WITH AXILLARY SENTINEL NODE BIOPSY: SHX6098

## 2012-12-24 HISTORY — PX: AXILLARY SENTINEL NODE BIOPSY: SHX5738

## 2012-12-24 SURGERY — SIMPLE MASTECTOMY
Anesthesia: General | Site: Breast | Laterality: Right | Wound class: Clean

## 2012-12-24 MED ORDER — ONDANSETRON HCL 4 MG PO TABS: 4.0000 mg | ORAL_TABLET | Freq: Four times a day (QID) | ORAL | Status: DC | PRN

## 2012-12-24 MED ORDER — OXYCODONE HCL 5 MG/5ML PO SOLN: 5.0000 mg | Freq: Once | ORAL | Status: DC | PRN

## 2012-12-24 MED ORDER — ACETAMINOPHEN 10 MG/ML IV SOLN
INTRAVENOUS | Status: AC
  Filled 2012-12-24: qty 100

## 2012-12-24 MED ORDER — TECHNETIUM TC 99M SULFUR COLLOID FILTERED
1.0000 | Freq: Once | INTRAVENOUS | Status: AC | PRN
  Administered 2012-12-24: 1 via INTRADERMAL

## 2012-12-24 MED ORDER — SODIUM CHLORIDE 0.9 % IR SOLN
Status: DC | PRN
  Administered 2012-12-24 (×3): 1

## 2012-12-24 MED ORDER — BUPIVACAINE HCL (PF) 0.25 % IJ SOLN
INTRAMUSCULAR | Status: DC | PRN
  Administered 2012-12-24: 30 mL

## 2012-12-24 MED ORDER — LIDOCAINE HCL (CARDIAC) 20 MG/ML IV SOLN
INTRAVENOUS | Status: DC | PRN
  Administered 2012-12-24: 50 mg via INTRAVENOUS

## 2012-12-24 MED ORDER — ONDANSETRON HCL 4 MG/2ML IJ SOLN
INTRAMUSCULAR | Status: DC | PRN
  Administered 2012-12-24: 4 mg via INTRAVENOUS

## 2012-12-24 MED ORDER — BUPIVACAINE HCL (PF) 0.25 % IJ SOLN
INTRAMUSCULAR | Status: AC
  Filled 2012-12-24: qty 30

## 2012-12-24 MED ORDER — ROCURONIUM BROMIDE 100 MG/10ML IV SOLN
INTRAVENOUS | Status: DC | PRN
  Administered 2012-12-24: 50 mg via INTRAVENOUS

## 2012-12-24 MED ORDER — METHYLENE BLUE 1 % INJ SOLN
INTRAMUSCULAR | Status: AC
  Filled 2012-12-24: qty 10

## 2012-12-24 MED ORDER — CEFAZOLIN SODIUM 1-5 GM-% IV SOLN
1.0000 g | Freq: Four times a day (QID) | INTRAVENOUS | Status: AC
  Administered 2012-12-24 – 2012-12-25 (×3): 1 g via INTRAVENOUS
  Filled 2012-12-24 (×4): qty 50

## 2012-12-24 MED ORDER — NEOSTIGMINE METHYLSULFATE 1 MG/ML IJ SOLN
INTRAMUSCULAR | Status: DC | PRN
  Administered 2012-12-24: 3 mg via INTRAVENOUS

## 2012-12-24 MED ORDER — FENTANYL CITRATE 0.05 MG/ML IJ SOLN
INTRAMUSCULAR | Status: DC | PRN
  Administered 2012-12-24: 100 ug via INTRAVENOUS
  Administered 2012-12-24: 50 ug via INTRAVENOUS
  Administered 2012-12-24: 150 ug via INTRAVENOUS
  Administered 2012-12-24 (×2): 100 ug via INTRAVENOUS

## 2012-12-24 MED ORDER — OXYCODONE HCL 5 MG PO TABS: 5.0000 mg | ORAL_TABLET | Freq: Once | ORAL | Status: DC | PRN

## 2012-12-24 MED ORDER — PROMETHAZINE HCL 25 MG/ML IJ SOLN: 6.2500 mg | INTRAMUSCULAR | Status: DC | PRN

## 2012-12-24 MED ORDER — KCL-LACTATED RINGERS-D5W 20 MEQ/L IV SOLN
INTRAVENOUS | Status: DC
  Administered 2012-12-24 – 2012-12-25 (×2): via INTRAVENOUS
  Filled 2012-12-24 (×5): qty 1000

## 2012-12-24 MED ORDER — MORPHINE SULFATE 2 MG/ML IJ SOLN
2.0000 mg | INTRAMUSCULAR | Status: DC | PRN
  Administered 2012-12-24 (×2): 4 mg via INTRAVENOUS
  Administered 2012-12-25: 2 mg via INTRAVENOUS
  Filled 2012-12-24 (×3): qty 2

## 2012-12-24 MED ORDER — ACETAMINOPHEN 10 MG/ML IV SOLN
INTRAVENOUS | Status: DC | PRN
  Administered 2012-12-24: 1000 mg via INTRAVENOUS

## 2012-12-24 MED ORDER — MIDAZOLAM HCL 5 MG/5ML IJ SOLN
INTRAMUSCULAR | Status: DC | PRN
  Administered 2012-12-24: 2 mg via INTRAVENOUS

## 2012-12-24 MED ORDER — OXYCODONE HCL 5 MG PO TABS
5.0000 mg | ORAL_TABLET | ORAL | Status: DC | PRN
  Administered 2012-12-25: 10 mg via ORAL
  Filled 2012-12-24: qty 2

## 2012-12-24 MED ORDER — HYDROMORPHONE HCL PF 1 MG/ML IJ SOLN
INTRAMUSCULAR | Status: AC
  Administered 2012-12-24: 0.5 mg via INTRAVENOUS
  Filled 2012-12-24: qty 1

## 2012-12-24 MED ORDER — LACTATED RINGERS IV SOLN
INTRAVENOUS | Status: DC | PRN
  Administered 2012-12-24: 07:00:00 via INTRAVENOUS

## 2012-12-24 MED ORDER — PROPOFOL 10 MG/ML IV BOLUS
INTRAVENOUS | Status: DC | PRN
  Administered 2012-12-24: 150 mg via INTRAVENOUS

## 2012-12-24 MED ORDER — ONDANSETRON HCL 4 MG/2ML IJ SOLN
4.0000 mg | INTRAMUSCULAR | Status: DC | PRN
  Administered 2012-12-24 (×2): 4 mg via INTRAVENOUS
  Filled 2012-12-24 (×2): qty 2

## 2012-12-24 MED ORDER — GLYCOPYRROLATE 0.2 MG/ML IJ SOLN
INTRAMUSCULAR | Status: DC | PRN
  Administered 2012-12-24: 0.6 mg via INTRAVENOUS

## 2012-12-24 MED ORDER — LEVOTHYROXINE SODIUM 125 MCG PO TABS
125.0000 ug | ORAL_TABLET | Freq: Every day | ORAL | Status: DC
  Administered 2012-12-25: 125 ug via ORAL
  Filled 2012-12-24 (×3): qty 1

## 2012-12-24 MED ORDER — HYDROMORPHONE HCL PF 1 MG/ML IJ SOLN
0.2500 mg | INTRAMUSCULAR | Status: DC | PRN
  Administered 2012-12-24 (×2): 0.5 mg via INTRAVENOUS

## 2012-12-24 SURGICAL SUPPLY — 67 items
APPLIER CLIP 9.375 MED OPEN (MISCELLANEOUS) ×3
BENZOIN TINCTURE PRP APPL 2/3 (GAUZE/BANDAGES/DRESSINGS) ×6 IMPLANT
BINDER BREAST LRG (GAUZE/BANDAGES/DRESSINGS) IMPLANT
BINDER BREAST XLRG (GAUZE/BANDAGES/DRESSINGS) IMPLANT
BINDER BREAST XXLRG (GAUZE/BANDAGES/DRESSINGS) ×3 IMPLANT
BIOPATCH RED 1 DISK 7.0 (GAUZE/BANDAGES/DRESSINGS) ×12 IMPLANT
BLADE SURG 10 STRL SS (BLADE) ×3 IMPLANT
BLADE SURG 15 STRL LF DISP TIS (BLADE) ×2 IMPLANT
BLADE SURG 15 STRL SS (BLADE) ×1
CANISTER SUCTION 2500CC (MISCELLANEOUS) ×3 IMPLANT
CHLORAPREP W/TINT 26ML (MISCELLANEOUS) ×6 IMPLANT
CLIP APPLIE 9.375 MED OPEN (MISCELLANEOUS) ×2 IMPLANT
CLOTH BEACON ORANGE TIMEOUT ST (SAFETY) ×3 IMPLANT
CONT SPEC 4OZ CLIKSEAL STRL BL (MISCELLANEOUS) IMPLANT
COVER PROBE W GEL 5X96 (DRAPES) ×3 IMPLANT
COVER SURGICAL LIGHT HANDLE (MISCELLANEOUS) ×3 IMPLANT
DERMABOND ADVANCED (GAUZE/BANDAGES/DRESSINGS)
DERMABOND ADVANCED .7 DNX12 (GAUZE/BANDAGES/DRESSINGS) IMPLANT
DRAIN CHANNEL 19F RND (DRAIN) ×12 IMPLANT
DRAPE CHEST BREAST 15X10 FENES (DRAPES) IMPLANT
DRAPE ORTHO SPLIT 77X108 STRL (DRAPES) ×2
DRAPE PROXIMA HALF (DRAPES) ×6 IMPLANT
DRAPE SURG ORHT 6 SPLT 77X108 (DRAPES) ×4 IMPLANT
DRAPE UTILITY 15X26 W/TAPE STR (DRAPE) ×12 IMPLANT
DRSG OPSITE 4X5.5 SM (GAUZE/BANDAGES/DRESSINGS) IMPLANT
ELECT CAUTERY BLADE 6.4 (BLADE) ×3 IMPLANT
ELECT REM PT RETURN 9FT ADLT (ELECTROSURGICAL) ×3
ELECTRODE REM PT RTRN 9FT ADLT (ELECTROSURGICAL) ×2 IMPLANT
EVACUATOR SILICONE 100CC (DRAIN) ×12 IMPLANT
GLOVE BIO SURGEON STRL SZ7 (GLOVE) ×3 IMPLANT
GLOVE BIOGEL PI IND STRL 7.0 (GLOVE) ×2 IMPLANT
GLOVE BIOGEL PI IND STRL 8 (GLOVE) ×2 IMPLANT
GLOVE BIOGEL PI INDICATOR 7.0 (GLOVE) ×1
GLOVE BIOGEL PI INDICATOR 8 (GLOVE) ×1
GLOVE ECLIPSE 6.5 STRL STRAW (GLOVE) ×6 IMPLANT
GLOVE ECLIPSE 8.0 STRL XLNG CF (GLOVE) ×9 IMPLANT
GLOVE SS BIOGEL STRL SZ 6 (GLOVE) ×2 IMPLANT
GLOVE SUPERSENSE BIOGEL SZ 6 (GLOVE) ×1
GLOVE SURG SS PI 6.0 STRL IVOR (GLOVE) ×3 IMPLANT
GLOVE SURG SS PI 7.5 STRL IVOR (GLOVE) ×6 IMPLANT
GOWN STRL NON-REIN LRG LVL3 (GOWN DISPOSABLE) ×9 IMPLANT
GOWN STRL REIN XL XLG (GOWN DISPOSABLE) ×3 IMPLANT
KIT BASIN OR (CUSTOM PROCEDURE TRAY) ×3 IMPLANT
KIT ROOM TURNOVER OR (KITS) ×3 IMPLANT
NEEDLE HYPO 25GX1X1/2 BEV (NEEDLE) ×3 IMPLANT
NS IRRIG 1000ML POUR BTL (IV SOLUTION) ×9 IMPLANT
PACK GENERAL/GYN (CUSTOM PROCEDURE TRAY) ×3 IMPLANT
PACK SURGICAL SETUP 50X90 (CUSTOM PROCEDURE TRAY) ×3 IMPLANT
PAD ARMBOARD 7.5X6 YLW CONV (MISCELLANEOUS) ×3 IMPLANT
PENCIL BUTTON HOLSTER BLD 10FT (ELECTRODE) ×3 IMPLANT
SPECIMEN JAR X LARGE (MISCELLANEOUS) ×6 IMPLANT
SPONGE GAUZE 4X4 12PLY (GAUZE/BANDAGES/DRESSINGS) ×6 IMPLANT
SPONGE LAP 18X18 X RAY DECT (DISPOSABLE) ×12 IMPLANT
SPONGE LAP 4X18 X RAY DECT (DISPOSABLE) ×3 IMPLANT
STAPLER VISISTAT 35W (STAPLE) ×3 IMPLANT
STRIP CLOSURE SKIN 1/2X4 (GAUZE/BANDAGES/DRESSINGS) ×12 IMPLANT
SUT ETHILON 2 0 FS 18 (SUTURE) ×12 IMPLANT
SUT MNCRL AB 4-0 PS2 18 (SUTURE) ×3 IMPLANT
SUT MON AB 4-0 PC3 18 (SUTURE) ×15 IMPLANT
SUT SILK 2 0 FS (SUTURE) ×3 IMPLANT
SUT VIC AB 3-0 SH 18 (SUTURE) ×18 IMPLANT
SYR CONTROL 10ML LL (SYRINGE) ×3 IMPLANT
TAPE CLOTH SURG 6X10 WHT LF (GAUZE/BANDAGES/DRESSINGS) ×3 IMPLANT
TOWEL OR 17X24 6PK STRL BLUE (TOWEL DISPOSABLE) ×3 IMPLANT
TOWEL OR 17X26 10 PK STRL BLUE (TOWEL DISPOSABLE) ×3 IMPLANT
TUBE CONNECTING 12X1/4 (SUCTIONS) ×3 IMPLANT
YANKAUER SUCT BULB TIP NO VENT (SUCTIONS) ×3 IMPLANT

## 2012-12-24 NOTE — Transfer of Care (Signed)
Immediate Anesthesia Transfer of Care Note  Patient: Lauren Farley  Procedure(s) Performed: Procedure(s) with comments: BILATERAL MASTECTOMY (Bilateral) RIGHT AXILLARY SENTINEL LYMPH NODE BIOPSY (Right) - NUCLEAR MEDICINE INJECTION - RIGHT SIDE  Patient Location: PACU  Anesthesia Type:General  Level of Consciousness: awake and alert   Airway & Oxygen Therapy: Patient Spontanous Breathing and Patient connected to nasal cannula oxygen  Post-op Assessment: Report given to PACU RN and Post -op Vital signs reviewed and stable  Post vital signs: Reviewed and stable  Complications: No apparent anesthesia complications

## 2012-12-24 NOTE — Op Note (Signed)
Operative Note  Lauren Farley female 67 y.o. 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 Right axillary lymphatic mapping. 2. Right axilla sentinel lymph node biopsy. 3. Bilateral mastectomies.         Surgeon: Adolph Pollack   Assistants: Dr. Lodema Pilot  Anesthesia: General endotracheal anesthesia  Indications:   This is a 67 year old female with a palpable mass in the right breast. Biopsy demonstrated ductal carcinoma in situ with some cells suspicious for invasive cancer but not definitive. She chose to have bilateral mastectomies. I also recommend a right axillary sentinel lymph node biopsy and she is in agreement with that. She presents now for these procedures.    Procedure Detail:  She was seen in the holding area area the right axillary area was marked with my initials. Radioactive injection was performed in the right breast. She was then brought to the operating room. She's placed supine on the operating table and a general anesthetic was given. Lymphatic mapping in the right axilla was performed initially very low counts were noted. The breasts and axillary areas as well as upper arms were sterilely prepped and draped.  I began with the left mastectomy. An elliptical incision was made through the skin and dermis to incorporate the nipple areolar complex. Subcutaneous flaps were then raised superiorly to the clavicle, medially to the lateral border of the sternum, inferiorly to the anterior rectus sheath, and laterally to the latissimus muscle. Bleeding was controlled with electrocautery and interrupted Vicryl sutures. The medial aspect of the breast was marked with a suture. The breast was then dissected free of the pectoralis major muscle and fascia with electrocautery and handed off the field. The wound was copiously irrigated and bleeding points controlled with  electrocautery. Once hemostasis was adequate 2 small stab wounds were made in the inferior flap. Two 19 Blake drains were then placed into the wound and anchored to the skin with nylon suture. The subcutaneous tissue of the wound was then reapproximated with interrupted or procedures. The skin was closed with running 4-0 Monocryl subcuticular stitches.  Next, the right axilla was approached. Repeat mapping demonstrated increased counts in the inferior aspect of the right axilla. A transverse incision was made in the inferior aspect of the right axilla and carried down to the subcutaneous tissues until the axillary content area was entered. Using the neoprobe, identified an area of increased counts. This was a palpable lymph node. This was excised electrocautery. It was sent as sentinel lymph node #1. Placing the neoprobe back into the wound did not demonstrate any other areas of increased counts. Bleeding was controlled electrocautery. The subcutaneous tissue the wound was then approximated with interrupted 3-0 Vicryl sutures. The skin is close with 4-0 Monocryl subcuticular stitch.  Next the right breast was approached. An elliptical incision was made through the skin and dermis to include the nipple areolar complex. Subcutaneous flaps were then raised superiorly to the clavicle, medially to the lateral border of the sternum, inferiorly to the anterior rectus sheath, and laterally to the platysmas muscle. Bleeding was controlled with electrocautery and interrupted Vicryl sutures. The breast tissue was then dissected free from the pectoralis major muscle fascia with electrocautery. The medial aspect of the breast was marked. At this time a touch prep on the sentinel node came back negative for tumor.The breast was then handed off the field.  The right chest was inspected and  bleeding controlled electrocautery. Irrigation performed. 2 stab wounds are then made in the inferior flap. Two  #19 Blake drains were  then placed into the wound and anchored to the skin with nylon suture.  The subcutaneous tissue was approximated with interrupted 3-0 Vicryl sutures. The skin is closed with 4-0 Monocryl running subcuticular stitches. Benzoin and Steri-Strips were then applied to all wounds. Sterile dressings were applied. Drains were hooked up to closed suction.  She tolerated the procedures well without any apparent complications and was taken to the recovery room in satisfactory condition.  Estimated Blood Loss:  400 ml         Drains: JACKSON-PRATT (JP)  Blood Given: none          Specimens: Right axillary sentinel lymph node. Right breast. Left breast.        Complications:  * No complications entered in OR log *         Disposition: PACU - hemodynamically stable.         Condition: stable

## 2012-12-24 NOTE — Anesthesia Postprocedure Evaluation (Signed)
  Anesthesia Post-op Note  Patient: Lauren Farley  Procedure(s) Performed: Procedure(s) with comments: BILATERAL MASTECTOMY (Bilateral) RIGHT AXILLARY SENTINEL LYMPH NODE BIOPSY (Right) - NUCLEAR MEDICINE INJECTION - RIGHT SIDE  Patient Location: PACU  Anesthesia Type:General  Level of Consciousness: awake and alert   Airway and Oxygen Therapy: Patient Spontanous Breathing  Post-op Pain: mild  Post-op Assessment: Post-op Vital signs reviewed, Patient's Cardiovascular Status Stable, Respiratory Function Stable, Patent Airway, No signs of Nausea or vomiting and Pain level controlled  Post-op Vital Signs: stable  Complications: No apparent anesthesia complications

## 2012-12-24 NOTE — Interval H&P Note (Signed)
History and Physical Interval Note:  12/24/2012 7:24 AM  Lauren Farley  has presented today for surgery, with the diagnosis of breast cancer   The various methods of treatment have been discussed with the patient and family. After consideration of risks, benefits and other options for treatment, the patient has consented to  Procedure(s) with comments: bilateral MASTECTOMY (Bilateral) right AXILLARY SENTINEL lymph NODE BIOPSY (Right) - nuclear medicine injection at 7:00 right side  as a surgical intervention .  The patient's history has been reviewed, patient examined, no change in status, stable for surgery.  I have reviewed the patient's chart and labs.  Questions were answered to the patient's satisfaction.     ROSENBOWER,TODD Shela Commons

## 2012-12-24 NOTE — H&P (View-Only) (Signed)
Patient ID: Lauren Farley, female   DOB: 05/28/1946, 66 y.o.   MRN: [id number]  No chief complaint on file.   HPI Lauren Farley is a 66 y.o. female.   HPI  She is referred by Dr. Arceo for newly diagnosed DCIS of right breast.  Approximately 2 months ago, she noted a lump in the right breast underneath the nipple at the 9:00 position. A mammogram was performed which demonstrated the lesion. An image guided biopsy was done. 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 It was hormone receptor positive. Proliferation marker was 6%.  No family history of breast cancer. She is unsure of the age at menarche. No hormone replacement therapy. She had a hysterectomy in the 1980s. She's had no pain from the mass. No nipple discharge.  Past Medical History  Diagnosis Date  . Arthritis   . Back pain   . Hot flashes   . Cancer     Breast  . Thyroid disease     Graves disease s/p RAI treatment    Past Surgical History  Procedure Laterality Date  . Bunionectomy Bilateral   . Abdominal hysterectomy      TAH/BSO    History reviewed. No pertinent family history.  Social History History  Substance Use Topics  . Smoking status: Former Smoker  . Smokeless tobacco: Not on file  . Alcohol Use: Yes    Not on File  No current outpatient prescriptions on file.   No current facility-administered medications for this visit.    Review of Systems Review of Systems  Constitutional: Positive for fatigue.  HENT: Negative.   Eyes: Negative.   Respiratory: Negative.   Cardiovascular: Negative.   Gastrointestinal: Negative.   Genitourinary: Negative.   Musculoskeletal: Positive for back pain.  Skin: Negative.   Hematological: Negative.     There were no vitals taken for this visit.  Physical Exam Physical Exam  Constitutional:  Obese female.  HENT:  Head: Normocephalic and atraumatic.  Eyes:  EOM are normal. No scleral icterus.  Left exophthalmos  Neck: Neck supple.  Cardiovascular: Normal rate and regular rhythm.   Pulmonary/Chest: Effort normal and breath sounds normal.  Right breast-palpable 2-2.5 cm mass at the 9:00 position of the nipple areolar complex.  Left breast-no palpable mass or suspicious skin change.  No palpable axillary or supraclavicular adenopathy  Abdominal: Soft. She exhibits no mass. There is no tenderness.  Obese. Lower midline scar.  Musculoskeletal: Normal range of motion. She exhibits no edema.  Lymphadenopathy:    She has no cervical adenopathy.  Neurological: She is alert.  Skin: Skin is warm and dry.    Data Reviewed Mammogram. Pathology report.  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 We discussed breast conservation therapy versus mastectomy. She wants bilateral mastectomies. She does not want reconstruction.    Plan    Cancel MRI. We'll plan on bilateral mastectomies with right axillary sentinel lymph node biopsy.  I have explained the procedure, risks, and aftercare to her.  Risks include but are not limited to bleeding, infection, wound problems, anesthesia, chronic chest wall pain, nerve injury, seroma formation, lymphedema.  She seems to understand and agrees with the plan.        Lauren Farley J 12/04/2012, 11:26 AM    

## 2012-12-24 NOTE — Preoperative (Signed)
Beta Blockers   Reason not to administer Beta Blockers:Not Applicable 

## 2012-12-24 NOTE — Anesthesia Procedure Notes (Signed)
Procedure Name: Intubation Date/Time: 12/24/2012 7:50 AM Performed by: Gwenyth Allegra Pre-anesthesia Checklist: Patient identified, Timeout performed, Emergency Drugs available, Suction available and Patient being monitored Patient Re-evaluated:Patient Re-evaluated prior to inductionOxygen Delivery Method: Circle system utilized Preoxygenation: Pre-oxygenation with 100% oxygen Intubation Type: IV induction Ventilation: Oral airway inserted - appropriate to patient size Laryngoscope Size: Mac and 4 Grade View: Grade II Tube type: Oral Tube size: 7.0 mm Number of attempts: 1 Airway Equipment and Method: Stylet Placement Confirmation: ETT inserted through vocal cords under direct vision,  breath sounds checked- equal and bilateral and positive ETCO2 Secured at: 22 cm Tube secured with: Tape Dental Injury: Teeth and Oropharynx as per pre-operative assessment

## 2012-12-24 NOTE — Anesthesia Preprocedure Evaluation (Signed)
Anesthesia Evaluation  Patient identified by MRN, date of birth, ID band Patient awake, Patient confused and Patient unresponsive    Airway Mallampati: I TM Distance: >3 FB Neck ROM: Full    Dental   Pulmonary former smoker,  breath sounds clear to auscultation        Cardiovascular Rhythm:Regular Rate:Normal     Neuro/Psych  Headaches,    GI/Hepatic GERD-  ,  Endo/Other  Hypothyroidism   Renal/GU      Musculoskeletal   Abdominal (+) + obese,   Peds  Hematology   Anesthesia Other Findings   Reproductive/Obstetrics Breast ca                           Anesthesia Physical Anesthesia Plan  ASA: II  Anesthesia Plan: General   Post-op Pain Management:    Induction: Intravenous  Airway Management Planned: Oral ETT  Additional Equipment:   Intra-op Plan:   Post-operative Plan: Extubation in OR  Informed Consent: I have reviewed the patients History and Physical, chart, labs and discussed the procedure including the risks, benefits and alternatives for the proposed anesthesia with the patient or authorized representative who has indicated his/her understanding and acceptance.     Plan Discussed with: CRNA and Surgeon  Anesthesia Plan Comments:         Anesthesia Quick Evaluation

## 2012-12-25 MED ORDER — OXYCODONE HCL 5 MG PO TABS: 5.0000 mg | ORAL_TABLET | ORAL | Status: DC | PRN

## 2012-12-25 NOTE — Progress Notes (Signed)
1 Day Post-Op  Subjective: Had a little nausea this AM.  Walked with assistance.  Objective: Vital signs in last 24 hours: Temp:  [97.2 F (36.2 C)-98.5 F (36.9 C)] 97.9 F (36.6 C) (03/12 0627) Pulse Rate:  [56-87] 79 (03/12 0627) Resp:  [8-19] 18 (03/12 0627) BP: (113-168)/(62-79) 115/71 mmHg (03/12 0627) SpO2:  [98 %-100 %] 98 % (03/12 0627) Weight:  [218 lb (98.884 kg)] 218 lb (98.884 kg) (03/12 0212) Last BM Date: 12/24/12  Intake/Output from previous day: 03/11 0701 - 03/12 0700 In: 1400 [I.V.:1400] Out: 2748 [Urine:2150; Drains:448; Blood:150] Intake/Output this shift:    PE: General- In NAD Chest-dressings dry, flaps flat, thin serosanguinous output from drains  Lab Results:  No results found for this basename: WBC, HGB, HCT, PLT,  in the last 72 hours BMET No results found for this basename: NA, K, CL, CO2, GLUCOSE, BUN, CREATININE, CALCIUM,  in the last 72 hours PT/INR No results found for this basename: LABPROT, INR,  in the last 72 hours Comprehensive Metabolic Panel:    Component Value Date/Time   NA 143 12/04/2012 0826   NA 141 02/26/2009 2217   K 4.1 12/04/2012 0826   K 4.2 02/26/2009 2217   CL 106 12/04/2012 0826   CL 103 02/26/2009 2217   CO2 27 12/04/2012 0826   CO2 26 02/26/2009 2217   BUN 11.6 12/04/2012 0826   BUN 8 02/26/2009 2217   CREATININE 0.8 12/04/2012 0826   CREATININE 0.76 02/26/2009 2217   GLUCOSE 99 12/04/2012 0826   GLUCOSE 75 02/26/2009 2217   CALCIUM 9.8 12/04/2012 0826   CALCIUM 9.1 02/26/2009 2217   AST 16 12/04/2012 0826   AST 25 02/26/2009 2217   ALT 16 12/04/2012 0826   ALT 16 02/26/2009 2217   ALKPHOS 147 12/04/2012 0826   ALKPHOS 123* 02/26/2009 2217   BILITOT 0.43 12/04/2012 0826   BILITOT 0.6 02/26/2009 2217   PROT 7.4 12/04/2012 0826   PROT 6.9 02/26/2009 2217   ALBUMIN 3.6 12/04/2012 0826   ALBUMIN 4.1 02/26/2009 2217     Studies/Results: Nm Sentinel Node Inj-no Rpt (breast)  12/24/2012  CLINICAL DATA: Right breast cancer    Sulfur colloid was injected intradermally by the nuclear medicine  technologist for breast cancer sentinel node localization.      Anti-infectives: Anti-infectives   Start     Dose/Rate Route Frequency Ordered Stop   12/24/12 1530  ceFAZolin (ANCEF) IVPB 1 g/50 mL premix     1 g 100 mL/hr over 30 Minutes Intravenous Every 6 hours 12/24/12 1501 12/25/12 0503   12/24/12 0600  ceFAZolin (ANCEF) IVPB 2 g/50 mL premix     2 g 100 mL/hr over 30 Minutes Intravenous On call to O.R. 12/23/12 1418 12/24/12 0730      Assessment Doing well thus far.    LOS: 1 day   Plan: Teach drain care.  Decrease IVF.  Ambulate.  Home later today or tomorrow.   ROSENBOWER,TODD J 12/25/2012

## 2012-12-25 NOTE — Progress Notes (Signed)
Patient discharged to home with instructions, verbalized understanding. 

## 2012-12-26 ENCOUNTER — Encounter (INDEPENDENT_AMBULATORY_CARE_PROVIDER_SITE_OTHER): Payer: Self-pay | Admitting: General Surgery

## 2012-12-26 NOTE — Progress Notes (Signed)
Patient ID: Lauren Farley, female   DOB: 12-07-1945, 67 y.o.   MRN: [id number] I spoke with her today and she is doing well. I explained her results to her and did tell her there is a small focus of invasive cancer present but the sentinel lymph node was negative. The left breast was negative for malignancy. She seemed to understand all this.

## 2012-12-26 NOTE — Discharge Summary (Signed)
Physician Discharge Summary  Patient ID: Lauren Farley MRN: [id number] DOB/AGE: 1946/04/24 67 y.o.  Admit date: 12/24/2012 Discharge date: 12/25/2012  Admission Diagnoses:  DCIS of Right Breast  Discharge Diagnoses:  Same    Discharged Condition: good  Hospital Course:   She underwent bilateral mastectomies and right axillary sentinel lymph node biopsy on 12/24/12 and tolerated this well.  She and her family were given drain care instructions.  She was able to be discharged on POD #1 with discharge instructions.  She will follow up in the office in about 5 days.  Consults: None  Significant Diagnostic Studies: none   Treatments: surgery: Bilateral mastectomies and right axillary sentinel lymph node biopsy.  Discharge Exam: Blood pressure 115/71, pulse 79, temperature 97.9 F (36.6 C), temperature source Oral, resp. rate 18, height 5\' 6"  (1.676 m), weight 218 lb (98.884 kg), SpO2 98.00%.   Disposition: 01-Home or Self Care   Future Appointments Provider Department Dept Phone   12/30/2012 1:40 PM Shelly Rubenstein, MD [facility], [place] [phone]   03/03/2013 11:30 AM Victorino December, MD [facility] [phone]       Medication List    TAKE these medications       levothyroxine 125 MCG tablet  Commonly known as:  SYNTHROID, LEVOTHROID  Take 125 mcg by mouth daily.     oxyCODONE 5 MG immediate release tablet  Commonly known as:  Oxy IR/ROXICODONE  Take 1-2 tablets (5-10 mg total) by mouth every 4 (four) hours as needed.     Vitamin D3 2000 UNITS Tabs  Take 1 tablet by mouth daily.         Signed: Adolph Pollack 12/26/2012, 10:47 AM

## 2012-12-28 ENCOUNTER — Encounter (HOSPITAL_COMMUNITY): Payer: Self-pay | Admitting: General Surgery

## 2012-12-30 ENCOUNTER — Encounter (INDEPENDENT_AMBULATORY_CARE_PROVIDER_SITE_OTHER): Payer: Self-pay | Admitting: Surgery

## 2012-12-30 ENCOUNTER — Ambulatory Visit (INDEPENDENT_AMBULATORY_CARE_PROVIDER_SITE_OTHER): Payer: Medicaid Other | Admitting: Surgery

## 2012-12-30 VITALS — BP 126/80 | HR 70 | Temp 98.6°F | Resp 16 | Ht 66.0 in | Wt 211.2 lb

## 2012-12-30 DIAGNOSIS — Z09 Encounter for follow-up examination after completed treatment for conditions other than malignant neoplasm: Secondary | ICD-10-CM

## 2012-12-30 NOTE — Progress Notes (Signed)
Subjective:     Patient ID: Lauren Farley, female   DOB: 17-Mar-1946, 67 y.o.   MRN: [id number]  HPI This is a patient of Dr. Maris Berger who is here for her first postoperative visit status post bilateral mastectomies. She is doing well and has had no complaints.  Review of Systems     Objective:   Physical Exam On exam, her incisions are healing well. I removed the medial drains on each side. I gave her a copy of her pathology results.    Assessment:     Patient stable postop     Plan:     She will come back next week to see if her other drains can come out. She will continue to monitor the output recorded. I renewed her oxycodone.

## 2013-01-09 ENCOUNTER — Encounter (INDEPENDENT_AMBULATORY_CARE_PROVIDER_SITE_OTHER): Payer: Self-pay | Admitting: General Surgery

## 2013-01-09 ENCOUNTER — Ambulatory Visit (INDEPENDENT_AMBULATORY_CARE_PROVIDER_SITE_OTHER): Payer: Medicaid Other | Admitting: General Surgery

## 2013-01-09 VITALS — BP 120/76 | HR 92 | Temp 97.3°F | Resp 16 | Ht 66.0 in | Wt 209.0 lb

## 2013-01-09 DIAGNOSIS — Z9889 Other specified postprocedural states: Secondary | ICD-10-CM

## 2013-01-09 NOTE — Patient Instructions (Signed)
Continue your exercises.  Do not lift anything over 10 pounds.  May shower in two days.

## 2013-01-09 NOTE — Progress Notes (Signed)
Procedure:  Bilateral mastectomies and right axillary sentinel lymph node biopsy  Date:  12/24/2012  Pathology: T1N0  History:  She is here for another postoperative appointment. She's been doing some upper extremity exercises. Drain output is less than 30 cc a day on each side.  Exam: General- Is in NAD. Chest-Incisions are clean and intact. Moderate amount of firmness and swelling is present. Both remaining drains were removed and dressings applied.  Assessment: T1N0 right breast cancer s/p bilateral mastectomies and right axillary SLNBx-remaining drains removed.  Plan:  May shower in 2 days. Continue light activities with upper arms. Return visit 2 weeks.

## 2013-01-27 ENCOUNTER — Ambulatory Visit (INDEPENDENT_AMBULATORY_CARE_PROVIDER_SITE_OTHER): Payer: Medicaid Other | Admitting: General Surgery

## 2013-01-27 ENCOUNTER — Encounter (INDEPENDENT_AMBULATORY_CARE_PROVIDER_SITE_OTHER): Payer: Self-pay | Admitting: General Surgery

## 2013-01-27 VITALS — BP 118/82 | HR 83 | Temp 98.0°F | Resp 18 | Ht 66.0 in | Wt 212.4 lb

## 2013-01-27 DIAGNOSIS — Z9889 Other specified postprocedural states: Secondary | ICD-10-CM

## 2013-01-27 NOTE — Progress Notes (Signed)
Procedure:  Bilateral mastectomies and right axillary sentinel lymph node biopsy  Date:  12/24/2012  Pathology: T1N0  History:  She is here for postoperative appointment.  She is slowly improving.  Here upper extremity range of motion is improving. Exam: General- Is in NAD. Chest-Incisions are clean and intact. Less firmness and swelling is present.  Assessment: T1N0 right breast cancer s/p bilateral mastectomies and right axillary SLNBx-wounds healing well.  Plan:    Start resuming normal activities as tolerated.  Return visit in one month.

## 2013-01-27 NOTE — Patient Instructions (Signed)
May do activities as tolerated as we discussed.

## 2013-02-06 ENCOUNTER — Encounter: Payer: Self-pay | Admitting: *Deleted

## 2013-02-06 NOTE — Progress Notes (Signed)
Mailed after appt letter to pt. 

## 2013-02-27 ENCOUNTER — Encounter (INDEPENDENT_AMBULATORY_CARE_PROVIDER_SITE_OTHER): Payer: Self-pay | Admitting: General Surgery

## 2013-02-27 ENCOUNTER — Ambulatory Visit (INDEPENDENT_AMBULATORY_CARE_PROVIDER_SITE_OTHER): Payer: Medicaid Other | Admitting: General Surgery

## 2013-02-27 VITALS — BP 124/82 | HR 92 | Resp 16 | Ht 66.0 in | Wt 209.2 lb

## 2013-02-27 DIAGNOSIS — Z9889 Other specified postprocedural states: Secondary | ICD-10-CM

## 2013-02-27 NOTE — Patient Instructions (Signed)
May do activities as tolerated.

## 2013-02-27 NOTE — Progress Notes (Signed)
Procedure:  Bilateral mastectomies and right axillary sentinel lymph node biopsy  Date:  12/24/2012  Pathology: T1N0  History:  She is here for another postoperative appointment.  She still has sometimes when she is sore. Still has some swelling. She was fitted for a mastectomy bra with prostheses. Exam: General- Is in NAD. Chest-Incisions are clean and intact. Swelling is slowly decreasing. Assessment: T1N0 right breast cancer s/p bilateral mastectomies and right axillary SLNBx-wounds healing well.  Plan:    Return in 3 months.

## 2013-03-03 ENCOUNTER — Encounter: Payer: Self-pay | Admitting: Oncology

## 2013-03-03 ENCOUNTER — Telehealth: Payer: Self-pay | Admitting: *Deleted

## 2013-03-03 ENCOUNTER — Ambulatory Visit (HOSPITAL_BASED_OUTPATIENT_CLINIC_OR_DEPARTMENT_OTHER): Payer: Medicaid Other | Admitting: Oncology

## 2013-03-03 VITALS — BP 137/86 | HR 88 | Temp 98.4°F | Resp 20 | Ht 66.0 in | Wt 210.8 lb

## 2013-03-03 DIAGNOSIS — C50411 Malignant neoplasm of upper-outer quadrant of right female breast: Secondary | ICD-10-CM

## 2013-03-03 DIAGNOSIS — C50919 Malignant neoplasm of unspecified site of unspecified female breast: Secondary | ICD-10-CM

## 2013-03-03 MED ORDER — ANASTROZOLE 1 MG PO TABS: 1.0000 mg | ORAL_TABLET | Freq: Every day | ORAL | Status: AC

## 2013-03-03 MED ORDER — ANASTROZOLE 1 MG PO TABS: 1.0000 mg | ORAL_TABLET | Freq: Every day | ORAL | Status: DC

## 2013-03-03 NOTE — Telephone Encounter (Signed)
appts made and printed. gv an appt for [facility] on 03/05/13@ 11:30am for a bone density....td

## 2013-03-03 NOTE — Progress Notes (Signed)
OFFICE PROGRESS NOTE  CC  MULBERRY,[name], MD [address] [place] [address] Dr. Teresa Coombs  Dr. 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 Patient was seen in the [facility] for discussion of her treatment options.    STAGE:  Cancer of upper-outer quadrant of female breast  Primary site: Breast (Right)  Staging method: AJCC 7th Edition  Clinical: Stage I (T1 , N0, cM0)  Summary: Stage I (T1, N0, cM0)   PRIOR THERAPY: #1patient noted a mass in the right breast underneath the nipple at the 9:00 position. Mammogram demonstrated the lesion. 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 The tumor was ER positive PR positive with a proliferation marker Ki-67 6%.  #2 patient is now status post bilateral mastectomies and right axillary sentinel lymph node biopsy on 12/24/2012. 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 Tumor was ER positive PR positive with a proliferation marker Ki-67 of 6%.  #3 we discussed adjuvant treatment withantiestrogen therapy with a remedy axilla 1 mg on a daily basis. A total of 5 years of therapy is recommended at this time risks and benefits were also discussed. She does need a bone density scan which was set up for her.   CURRENT THERAPY:Arimidex 1 mg daily to start 03/03/2013  INTERVAL HISTORY: Lauren Farley 67 y.o. female returns for followup visit after her mastectomies. Overall she's doing well without any problem she is tolerated her mastectomies well. She has not had reconstruction eventually she does plan on having reconstruction. She was seen by Dr. Odis Luster. She denies any nausea vomiting aches pains fevers chills or night sweats. She does have some tenderness  at the surgical site. She has no peripheral paresthesias remainder of the 10 point review of systems is negative.  MEDICAL HISTORY: Past Medical History  Diagnosis Date  . Arthritis   . Back pain   . Hot flashes   . Cancer     Breast  . Thyroid disease     Graves disease s/p RAI treatment  . Hypothyroidism   . Shortness of breath     with exertion  . Headache   . GERD (gastroesophageal reflux disease)     mygestic herbal  . Insomnia   . Constipation   . History of blood transfusion     vein stripping in 70's    ALLERGIES:  has No Known Allergies.  MEDICATIONS:  Current Outpatient Prescriptions  Medication Sig Dispense Refill  . Cholecalciferol (VITAMIN D3) 2000 UNITS TABS Take 1 tablet by mouth daily.      Marland Kitchen levothyroxine (SYNTHROID, LEVOTHROID) 125 MCG tablet Take 125 mcg by mouth daily.      Marland Kitchen oxyCODONE (OXY IR/ROXICODONE) 5 MG immediate release tablet Take 1-2 tablets (5-10 mg total) by mouth every 4 (four) hours as needed.  40 tablet  0   No current facility-administered medications for this visit.    SURGICAL HISTORY:  Past Surgical History  Procedure Laterality Date  . Bunionectomy Bilateral     spurs  . Abdominal hysterectomy      TAH/BSO  . Varicose vein surgery    . Knee arthroscopy      left  . Mastectomy modified radical Bilateral 12/24/2012    LYMPH NODE BIOPSY   . Simple mastectomy  with axillary sentinel node biopsy Bilateral 12/24/2012    Procedure: BILATERAL MASTECTOMY;  Surgeon: Adolph Pollack, MD;  Location: [facility] OR;  Service: General;  Laterality: Bilateral;  . Axillary sentinel node biopsy Right 12/24/2012    Procedure: RIGHT AXILLARY SENTINEL LYMPH NODE BIOPSY;  Surgeon: Adolph Pollack, MD;  Location: [facility] OR;  Service: General;  Laterality: Right;  NUCLEAR MEDICINE INJECTION - RIGHT SIDE    REVIEW OF SYSTEMS:  Pertinent items are noted in HPI.   HEALTH MAINTENANCE:  PHYSICAL EXAMINATION: Blood pressure 137/86, pulse 88, temperature 98.4  F (36.9 C), temperature source Oral, resp. rate 20, height 5\' 6"  (1.676 m), weight 210 lb 12.8 oz (95.618 kg). Body mass index is 34.04 kg/(m^2). ECOG PERFORMANCE STATUS: 0 - Asymptomatic  Patient is awake alert in no acute distress well-developed well-nourished female HEENT exam EOMI PERRLA sclerae anicteric no conjunctival pallor oral mucosa is moist neck is supple lungs are clear bilaterally to auscultation cardiovascular regular rate rhythm abdomen soft nontender no HSM extremities no edema bilateral surgical scar healing well the chest. No evidence of infections or recurrence.  LABORATORY DATA: Lab Results  Component Value Date   WBC 6.0 12/17/2012   HGB 14.2 12/17/2012   HCT 39.8 12/17/2012   MCV 87.7 12/17/2012   PLT 180 12/17/2012      Chemistry      Component Value Date/Time   NA 143 12/04/2012 0826   NA 141 02/26/2009 2217   K 4.1 12/04/2012 0826   K 4.2 02/26/2009 2217   CL 106 12/04/2012 0826   CL 103 02/26/2009 2217   CO2 27 12/04/2012 0826   CO2 26 02/26/2009 2217   BUN 11.6 12/04/2012 0826   BUN 8 02/26/2009 2217   CREATININE 0.8 12/04/2012 0826   CREATININE 0.76 02/26/2009 2217      Component Value Date/Time   CALCIUM 9.8 12/04/2012 0826   CALCIUM 9.1 02/26/2009 2217   ALKPHOS 147 12/04/2012 0826   ALKPHOS 123* 02/26/2009 2217   AST 16 12/04/2012 0826   AST 25 02/26/2009 2217   ALT 16 12/04/2012 0826   ALT 16 02/26/2009 2217   BILITOT 0.43 12/04/2012 0826   BILITOT 0.6 02/26/2009 2217     ADDITIONAL INFORMATION: 3. CHROMOGENIC IN-SITU HYBRIDIZATION Results: HER-2/NEU BY CISH - NO AMPLIFICATION OF HER-2 DETECTED. RESULT RATIO OF HER2: CEP 17 SIGNALS 1.24 AVERAGE HER2 COPY NUMBER PER CELL 1.80 REFERENCE RANGE NEGATIVE HER2/Chr17 Ratio <2.0 and Average HER2 copy number <4.0 EQUIVOCAL HER2/Chr17 Ratio <2.0 and Average HER2 copy number 4.0 and <6.0 POSITIVE HER2/Chr17 Ratio >=2.0 and/or Average HER2 copy number >=6.0 Pecola Leisure MD Pathologist, Electronic Signature ( Signed  12/31/2012) FINAL DIAGNOSIS Diagnosis 1. Lymph node, sentinel, biopsy, Right - ONE BENIGN LYMPH NODE WITH NO TUMOR SEEN (0/1). 2. Breast, simple mastectomy, Left - BENIGN BREAST PARENCHYMA WITH FIBROCYSTIC CHANGES. - FIBROADENOMA. - NO ATYPIA, HYPERPLASIA OR MALIGNANCY IDENTIFIED. - TWO BENIGN LYMPH NODES WITH NO TUMOR SEEN (0/2). 3. Breast, simple mastectomy, Right - INVASIVE (GRADE I) AND IN SITU (LOW GRADE) DUCTAL CARCINOMA WITH PAPILLARY FEATURES, SPANNING 1 of 4 FINAL for TENEA, SENS [phone]) Diagnosis(continued) 0.6 CM IN GREATEST DIMENSION. - SEPARATE INTRADUCTAL PAPILLOMA WITH LOW DUCTAL CARCINOMA IN SITU, SPANNING 1.5 CM IN GREATEST DIMENSION. - DEFINITIVE LYMPH/VASCULAR INVASION IS NOT IDENTIFIED. - MARGINS ARE NEGATIVE. - ONE BENIGN LYMPH NODE WITH NO TUMOR SEEN (0/1). - SEE ONCOLOGY TEMPLATE. Microscopic Comment 3. BREAST, INVASIVE TUMOR, WITH LYMPH NODE SAMPLING Specimen, including laterality: Right breast with right sentinel  lymph node sampling. Procedure: Right simple mastectomy with right sentinel lymph node biopsy. Grade: I. Tubule formation: 3. Nuclear pleomorphism: 1. Mitotic: 1. Tumor size (glass slide measurement): 0.6 in greatest dimension, see below comment. Margins: Invasive, distance to closest margin: 0.9 cm (anterior margin). In-situ, distance to closest margin: 0.5 cm (anterior margin). Lymphovascular invasion: Not identified. Ductal carcinoma in situ: Yes. Grade: Low grade. Extensive intraductal component: No. Lobular neoplasia: No. Tumor focality: Invasive ductal carcinoma is unifocal. Treatment effect: Not applicable. Extent of tumor: Invasive tumor is confined to breast parenchyma; intraductal papilloma with associated ductal carcinoma in situ involves the nipple. Lymph nodes: # examined: Two on the right and two on the left. Lymph nodes with metastasis: 0. Breast prognostic profile: Performed on previous case,  [id number]. Estrogen receptor: 100%, positive. Progesterone receptor: 100%, positive. HER-2/neu: 1.37, no amplification. Ki-67: 6%, low. Non-neoplastic breast: Hyalinized fibroadenoma, fibrosis and fat necrosis. TNM: pT1b, pN0, MX. Comments: Within sections taken from the large ill-defined mass, there are three small areas identified which show tumor. The largest focus of invasive tumor is 0.6 cm in greatest dimension. As a large portion of the mass consists of fat necrosis and fat necrosis, the 0.6 cm invasive focus will be used as the largest dimension for tumor staging purposes. A smooth muscle myosin heavy chain, p63 and calponin stains are all performed on a single block with tumor present within the large ill-defined mass. The stains are negative in the invasive nests while highlighting a nest of in situ carcinoma, confirming the presence of invasive and in situ ductal carcinoma. A smooth muscle myosin, calponin and p63 stain are also performed on the intraductal papilloma with ductal carcinoma in situ. They demonstrate a preserved myoepithelial layer, confirming the lack of invasive tumor within this area. A HER-2 neu by CISH will be repeated and reported in an addendum. (RAH:eps 12/26/12) 2 of  RADIOGRAPHIC STUDIES:  No results found.  ASSESSMENT: 67 year old female with  #1 stage I (T1 N0) invasive ductal carcinoma of the right breast status post bilateral mastectomies with a right sentinel lymph node biopsy. Postoperatively patient is doing well.  #2 she will proceed with adjuvant therapy consisting of arimidex 1 mg daily. There is no role for radiation therapy. We discussed the risks and benefits of arimidex. She will need a bone density scan will set this up   PLAN:   #1 Arimidex 1 mg daily prescription was sent to her pharmacy.  #2 bone density scan will be needed.  #3 she will take calcium and vitamin D.  #4 I will see her back in 3 months time for  followup.   All questions were answered. The patient knows to call the clinic with any problems, questions or concerns. We can certainly see the patient much sooner if necessary.  I spent 25 minutes counseling the patient face to face. The total time spent in the appointment was 30 minutes.    Drue Second, MD Medical/Oncology [facility] [phone] (beeper) [phone] (Office)  03/03/2013, 11:53 AM

## 2013-03-03 NOTE — Patient Instructions (Addendum)
We discussed your final pathology   We discussed helping to prevent breast cancer coming back in other parts of the body with anti-estrogen therapy (arimidex)   arimidex 1 mg daily for 5 years  I will also a bone density scan set up  I will see you back in 3 months for follow up to make sure you are doing well  Anastrozole tablets What is this medicine? ANASTROZOLE (an AS troe zole) is used to treat breast cancer in women who have gone through menopause. Some types of breast cancer depend on estrogen to grow, and this medicine can stop tumor growth by blocking estrogen production. This medicine may be used for other purposes; ask your health care provider or pharmacist if you have questions. What should I tell my health care provider before I take this medicine? They need to know if you have any of these conditions: -liver disease -an unusual or allergic reaction to anastrozole, other medicines, foods, dyes, or preservatives -pregnant or trying to get pregnant -breast-feeding How should I use this medicine? Take this medicine by mouth with a glass of water. Follow the directions on the prescription label. You can take this medicine with or without food. Take your doses at regular intervals. Do not take your medicine more often than directed. Do not stop taking except on the advice of your doctor or health care professional. Talk to your pediatrician regarding the use of this medicine in children. Special care may be needed. Overdosage: If you think you have taken too much of this medicine contact a poison control center or emergency room at once. NOTE: This medicine is only for you. Do not share this medicine with others. What if I miss a dose? If you miss a dose, take it as soon as you can. If it is almost time for your next dose, take only that dose. Do not take double or extra doses. What may interact with this medicine? Do not take this medicine with any of the following  medications: -female hormones, like estrogens or progestins and birth control pills This medicine may also interact with the following medications: -tamoxifen This list may not describe all possible interactions. Give your health care provider a list of all the medicines, herbs, non-prescription drugs, or dietary supplements you use. Also tell them if you smoke, drink alcohol, or use illegal drugs. Some items may interact with your medicine. What should I watch for while using this medicine? Visit your doctor or health care professional for regular checks on your progress. Let your doctor or health care professional know about any unusual vaginal bleeding. Do not treat yourself for diarrhea, nausea, vomiting or other side effects. Ask your doctor or health care professional for advice. What side effects may I notice from receiving this medicine? Side effects that you should report to your doctor or health care professional as soon as possible: -allergic reactions like skin rash, itching or hives, swelling of the face, lips, or tongue -any new or unusual symptoms -breathing problems -chest pain -leg pain or swelling -vomiting Side effects that usually do not require medical attention (report to your doctor or health care professional if they continue or are bothersome): -back or bone pain -cough, or throat infection -diarrhea or constipation -dizziness -headache -hot flashes -loss of appetite -nausea -sweating -weakness and tiredness -weight gain This list may not describe all possible side effects. Call your doctor for medical advice about side effects. You may report side effects to FDA at 1-800-FDA-1088. Where  should I keep my medicine? Keep out of the reach of children. Store at room temperature between 20 and 25 degrees C (68 and 77 degrees F). Throw away any unused medicine after the expiration date. NOTE: This sheet is a summary. It may not cover all possible information. If you  have questions about this medicine, talk to your doctor, pharmacist, or health care provider.  2013, [employer]. (12/13/2007 4:31:52 PM)

## 2013-03-05 ENCOUNTER — Telehealth (INDEPENDENT_AMBULATORY_CARE_PROVIDER_SITE_OTHER): Payer: Self-pay | Admitting: General Surgery

## 2013-03-05 NOTE — Telephone Encounter (Signed)
Informed the patient that paperwork was faxed to [facility] this morning.

## 2013-03-05 NOTE — Telephone Encounter (Signed)
Patient called and wanted Dr Abbey Chatters to write a Rx for a exercise prothesis bra and fax it back to [facility].

## 2013-04-09 ENCOUNTER — Encounter (INDEPENDENT_AMBULATORY_CARE_PROVIDER_SITE_OTHER): Payer: Self-pay | Admitting: General Surgery

## 2013-04-09 ENCOUNTER — Ambulatory Visit (INDEPENDENT_AMBULATORY_CARE_PROVIDER_SITE_OTHER): Payer: Medicaid Other | Admitting: General Surgery

## 2013-04-09 VITALS — BP 118/68 | HR 78 | Temp 98.4°F | Resp 16 | Ht 66.0 in | Wt 208.0 lb

## 2013-04-09 DIAGNOSIS — M792 Neuralgia and neuritis, unspecified: Secondary | ICD-10-CM

## 2013-04-09 DIAGNOSIS — IMO0002 Reserved for concepts with insufficient information to code with codable children: Secondary | ICD-10-CM

## 2013-04-09 NOTE — Progress Notes (Signed)
Patient ID: Lauren Farley, female   DOB: [birth year], 67 y.o.   MRN: [id number] Procedure: Bilateral mastectomies and right axillary sentinel lymph node biopsy  Date: 12/24/2012  The patient is a 67 year old female status post bilateral mastectomies and right axillary Sentinel lymph node dissection.  The patient arrives with a one-day history of left chest wall pain. She states it radiates down her abdominal area. She feels that wearing her prosthesis brought with and under groove has been causing some irritation. She states she is also interested in her exercise activities with her left arm. She says that this is not been there before. She has not had any pain medication or over-the-counter pain medication.  On exam: There is no erythema. There is minimal tenderness to palpation in her lateral axilla.    Assessment and plan: 1. When the patient tried over-the-counter ibuprofen and ice packs to her left axilla to see this helped the pain. 2. I discussed the patient should stay way from her on broad prosthesis at this time as this could be irritating subcutaneous nurse. 3. The patient will follow up with Dr. Abbey Chatters within the next week.

## 2013-04-21 ENCOUNTER — Ambulatory Visit (INDEPENDENT_AMBULATORY_CARE_PROVIDER_SITE_OTHER): Payer: Medicaid Other | Admitting: General Surgery

## 2013-04-21 ENCOUNTER — Encounter (INDEPENDENT_AMBULATORY_CARE_PROVIDER_SITE_OTHER): Payer: Self-pay | Admitting: General Surgery

## 2013-04-21 VITALS — BP 128/80 | HR 78 | Temp 97.8°F | Resp 16 | Ht 66.0 in | Wt 212.0 lb

## 2013-04-21 DIAGNOSIS — Z853 Personal history of malignant neoplasm of breast: Secondary | ICD-10-CM

## 2013-04-21 NOTE — Progress Notes (Signed)
Procedure:  Bilateral mastectomies and right axillary sentinel lymph node biopsy  Date:  12/24/2012  Pathology: T1N0  History:  She Is here for a followup visit. She's been having some discomfort in the left lateral chest wall area an axillary area and she's been doing some of her left arm exercises. She has been taking some herbs for this and it is getting better. She is concerned that she may have been something wrong by way of her exercises.  Exam: General- Is in NAD. Chest-Incisions are clean and intact. Still has some swelling on the left side in the lateral area and some redundant skin.  Assessment: T1N0 right breast cancer s/p bilateral mastectomies and right axillary SLNBx-wounds healing well; Still with some left-sided swelling and also has some redundant skin.  Plan:    Return in 3 months.  Continue her exercises.

## 2013-04-21 NOTE — Patient Instructions (Signed)
Continue to do your exercises.

## 2013-05-29 ENCOUNTER — Telehealth: Payer: Self-pay | Admitting: Oncology

## 2013-05-30 ENCOUNTER — Telehealth (INDEPENDENT_AMBULATORY_CARE_PROVIDER_SITE_OTHER): Payer: Self-pay

## 2013-05-30 ENCOUNTER — Other Ambulatory Visit (INDEPENDENT_AMBULATORY_CARE_PROVIDER_SITE_OTHER): Payer: Self-pay | Admitting: General Surgery

## 2013-05-30 DIAGNOSIS — C50911 Malignant neoplasm of unspecified site of right female breast: Secondary | ICD-10-CM

## 2013-05-30 NOTE — Telephone Encounter (Signed)
Patient calling in to check the status of Referral to Physical Therapy.  Patient states she dropped a form off at our office earlier this week for Dr. Abbey Chatters to sign but, when she called over to check the status of physcial therapy appointment she was told that they do not have any orders for this. Patient s/p Bilateral Mastectomy w/axillary lymphatic mapping on 12/24/12.  Please advise if we can place an order for patient to have physical therapy.

## 2013-05-30 NOTE — Telephone Encounter (Signed)
LM on pt's VM.  Referral has been entered in Epic and [facility] should be contacting her early next week to schedule an appointment time.

## 2013-06-01 NOTE — Telephone Encounter (Signed)
Noted  

## 2013-06-06 ENCOUNTER — Other Ambulatory Visit: Payer: Medicaid Other | Admitting: Lab

## 2013-06-06 ENCOUNTER — Ambulatory Visit: Payer: Medicaid Other | Admitting: Oncology

## 2013-06-11 ENCOUNTER — Ambulatory Visit: Payer: Medicaid Other | Attending: General Surgery | Admitting: Physical Therapy

## 2013-06-11 DIAGNOSIS — IMO0001 Reserved for inherently not codable concepts without codable children: Secondary | ICD-10-CM | POA: Insufficient documentation

## 2013-06-11 DIAGNOSIS — Z9010 Acquired absence of unspecified breast and nipple: Secondary | ICD-10-CM | POA: Insufficient documentation

## 2013-06-11 DIAGNOSIS — I890 Lymphedema, not elsewhere classified: Secondary | ICD-10-CM | POA: Insufficient documentation

## 2013-06-11 DIAGNOSIS — C50919 Malignant neoplasm of unspecified site of unspecified female breast: Secondary | ICD-10-CM | POA: Insufficient documentation

## 2013-06-11 DIAGNOSIS — M24519 Contracture, unspecified shoulder: Secondary | ICD-10-CM | POA: Insufficient documentation

## 2013-06-20 ENCOUNTER — Ambulatory Visit: Payer: Medicaid Other | Attending: General Surgery

## 2013-06-20 DIAGNOSIS — IMO0001 Reserved for inherently not codable concepts without codable children: Secondary | ICD-10-CM | POA: Insufficient documentation

## 2013-06-20 DIAGNOSIS — Z9010 Acquired absence of unspecified breast and nipple: Secondary | ICD-10-CM | POA: Insufficient documentation

## 2013-06-20 DIAGNOSIS — M24519 Contracture, unspecified shoulder: Secondary | ICD-10-CM | POA: Insufficient documentation

## 2013-06-20 DIAGNOSIS — I890 Lymphedema, not elsewhere classified: Secondary | ICD-10-CM | POA: Insufficient documentation

## 2013-06-20 DIAGNOSIS — C50919 Malignant neoplasm of unspecified site of unspecified female breast: Secondary | ICD-10-CM | POA: Insufficient documentation

## 2013-06-25 ENCOUNTER — Ambulatory Visit: Payer: Medicaid Other

## 2013-07-15 ENCOUNTER — Other Ambulatory Visit (HOSPITAL_BASED_OUTPATIENT_CLINIC_OR_DEPARTMENT_OTHER): Payer: Medicaid Other | Admitting: Lab

## 2013-07-15 ENCOUNTER — Ambulatory Visit (HOSPITAL_BASED_OUTPATIENT_CLINIC_OR_DEPARTMENT_OTHER): Payer: Medicaid Other | Admitting: Oncology

## 2013-07-15 ENCOUNTER — Telehealth: Payer: Self-pay | Admitting: *Deleted

## 2013-07-15 ENCOUNTER — Other Ambulatory Visit: Payer: Self-pay | Admitting: Emergency Medicine

## 2013-07-15 ENCOUNTER — Encounter: Payer: Self-pay | Admitting: Oncology

## 2013-07-15 VITALS — BP 133/90 | HR 74 | Temp 98.1°F | Resp 19 | Ht 66.0 in | Wt 218.9 lb

## 2013-07-15 DIAGNOSIS — C50419 Malignant neoplasm of upper-outer quadrant of unspecified female breast: Secondary | ICD-10-CM

## 2013-07-15 DIAGNOSIS — Z170 Estrogen receptor positive status [ER+]: Secondary | ICD-10-CM

## 2013-07-15 DIAGNOSIS — C50411 Malignant neoplasm of upper-outer quadrant of right female breast: Secondary | ICD-10-CM

## 2013-07-15 DIAGNOSIS — Z9010 Acquired absence of unspecified breast and nipple: Secondary | ICD-10-CM

## 2013-07-15 LAB — COMPREHENSIVE METABOLIC PANEL (CC13)
ALT: 17 U/L (ref 0–55)
AST: 19 U/L (ref 5–34)
Alkaline Phosphatase: 129 U/L (ref 40–150)
Calcium: 9.5 mg/dL (ref 8.4–10.4)
Chloride: 108 mEq/L (ref 98–109)
Creatinine: 0.8 mg/dL (ref 0.6–1.1)
Potassium: 4.5 mEq/L (ref 3.5–5.1)

## 2013-07-15 LAB — CBC WITH DIFFERENTIAL/PLATELET
BASO%: 1.2 % (ref 0.0–2.0)
EOS%: 5.2 % (ref 0.0–7.0)
MCH: 29.9 pg (ref 25.1–34.0)
MCHC: 33.9 g/dL (ref 31.5–36.0)
NEUT%: 46.8 % (ref 38.4–76.8)
RDW: 14.7 % — ABNORMAL HIGH (ref 11.2–14.5)
lymph#: 2.3 10*3/uL (ref 0.9–3.3)

## 2013-07-15 NOTE — Progress Notes (Signed)
OFFICE PROGRESS NOTE  CC  MULBERRY,[name], MD [address] [place] [address] Dr. Teresa Coombs  Dr. 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 Patient was seen in the [facility] for discussion of her treatment options.    STAGE:  Cancer of upper-outer quadrant of female breast  Primary site: Breast (Right)  Staging method: AJCC 7th Edition  Clinical: Stage I (T1 , N0, cM0)  Summary: Stage I (T1, N0, cM0)   PRIOR THERAPY: #1patient noted a mass in the right breast underneath the nipple at the 9:00 position. Mammogram demonstrated the lesion. 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 The tumor was ER positive PR positive with a proliferation marker Ki-67 6%.  #2 patient is now status post bilateral mastectomies and right axillary sentinel lymph node biopsy on 12/24/2012. 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 Tumor was ER positive PR positive with a proliferation marker Ki-67 of 6%.  #3 we discussed adjuvant treatment withantiestrogen therapy with a remedy axilla 1 mg on a daily basis. A total of 5 years of therapy is recommended at this time risks and benefits were also discussed. She does need a bone density scan which was set up for her.   #4 patient was recommended arimidex 1 mg daily adjuvantly but she did not have the prescription. She is very scared of the side effects CURRENT THERAPY:declined   INTERVAL HISTORY: Lauren Farley 67 y.o. female returns for followup visit.. Overall she's doing well without any problem she is tolerated her mastectomies well. She has not had reconstruction eventually she does plan on having reconstruction. She does not want any adjuvant treatments. She  denies any nausea vomiting aches pains fevers chills or night sweats. She does have some tenderness at the surgical site. She has no peripheral paresthesias remainder of the 10 point review of systems is negative.  MEDICAL HISTORY: Past Medical History  Diagnosis Date  . Arthritis   . Back pain   . Hot flashes   . Cancer     Breast  . Thyroid disease     Graves disease s/p RAI treatment  . Hypothyroidism   . Shortness of breath     with exertion  . Headache(784.0)   . GERD (gastroesophageal reflux disease)     mygestic herbal  . Insomnia   . Constipation   . History of blood transfusion     vein stripping in 70's    ALLERGIES:  has No Known Allergies.  MEDICATIONS:  Current Outpatient Prescriptions  Medication Sig Dispense Refill  . Cholecalciferol (VITAMIN D3) 2000 UNITS TABS Take 1 tablet by mouth daily.      Marland Kitchen levothyroxine (SYNTHROID, LEVOTHROID) 125 MCG tablet Take 125 mcg by mouth daily.       No current facility-administered medications for this visit.    SURGICAL HISTORY:  Past Surgical History  Procedure Laterality Date  . Bunionectomy Bilateral     spurs  . Abdominal hysterectomy      TAH/BSO  . Varicose vein surgery    . Knee arthroscopy      left  . Mastectomy modified radical Bilateral 12/24/2012    LYMPH NODE BIOPSY   . Simple mastectomy with axillary sentinel node biopsy Bilateral 12/24/2012    Procedure: BILATERAL  MASTECTOMY;  Surgeon: Adolph Pollack, MD;  Location: [facility] OR;  Service: General;  Laterality: Bilateral;  . Axillary sentinel node biopsy Right 12/24/2012    Procedure: RIGHT AXILLARY SENTINEL LYMPH NODE BIOPSY;  Surgeon: Adolph Pollack, MD;  Location: [facility] OR;  Service: General;  Laterality: Right;  NUCLEAR MEDICINE INJECTION - RIGHT SIDE    REVIEW OF SYSTEMS:  Pertinent items are noted in HPI.   HEALTH MAINTENANCE:  PHYSICAL EXAMINATION: Blood pressure 133/90, pulse 74, temperature 98.1 F (36.7 C), temperature source Oral, resp.  rate 19, height 5\' 6"  (1.676 m), weight 218 lb 14.4 oz (99.292 kg). Body mass index is 35.35 kg/(m^2). ECOG PERFORMANCE STATUS: 0 - Asymptomatic  Patient is awake alert in no acute distress well-developed well-nourished female HEENT exam EOMI PERRLA sclerae anicteric no conjunctival pallor oral mucosa is moist neck is supple lungs are clear bilaterally to auscultation cardiovascular regular rate rhythm abdomen soft nontender no HSM extremities no edema bilateral surgical scar healing well the chest. No evidence of infections or recurrence.  LABORATORY DATA: Lab Results  Component Value Date   WBC 6.3 07/15/2013   HGB 13.9 07/15/2013   HCT 40.9 07/15/2013   MCV 88.2 07/15/2013   PLT 186 07/15/2013      Chemistry      Component Value Date/Time   NA 143 12/04/2012 0826   NA 141 02/26/2009 2217   K 4.1 12/04/2012 0826   K 4.2 02/26/2009 2217   CL 106 12/04/2012 0826   CL 103 02/26/2009 2217   CO2 27 12/04/2012 0826   CO2 26 02/26/2009 2217   BUN 11.6 12/04/2012 0826   BUN 8 02/26/2009 2217   CREATININE 0.8 12/04/2012 0826   CREATININE 0.76 02/26/2009 2217      Component Value Date/Time   CALCIUM 9.8 12/04/2012 0826   CALCIUM 9.1 02/26/2009 2217   ALKPHOS 147 12/04/2012 0826   ALKPHOS 123* 02/26/2009 2217   AST 16 12/04/2012 0826   AST 25 02/26/2009 2217   ALT 16 12/04/2012 0826   ALT 16 02/26/2009 2217   BILITOT 0.43 12/04/2012 0826   BILITOT 0.6 02/26/2009 2217     ADDITIONAL INFORMATION: 3. CHROMOGENIC IN-SITU HYBRIDIZATION Results: HER-2/NEU BY C[facility] - NO AMPLIFICATION OF HER-2 DETECTED. RESULT RATIO OF HER2: CEP 17 SIGNALS 1.24 AVERAGE HER2 COPY NUMBER PER CELL 1.80 REFERENCE RANGE NEGATIVE HER2/Chr17 Ratio <2.0 and Average HER2 copy number <4.0 EQUIVOCAL HER2/Chr17 Ratio <2.0 and Average HER2 copy number 4.0 and <6.0 POSITIVE HER2/Chr17 Ratio >=2.0 and/or Average HER2 copy number >=6.0 Pecola Leisure MD Pathologist, Electronic Signature ( Signed 12/31/2012) FINAL DIAGNOSIS Diagnosis 1.  Lymph node, sentinel, biopsy, Right - ONE BENIGN LYMPH NODE WITH NO TUMOR SEEN (0/1). 2. Breast, simple mastectomy, Left - BENIGN BREAST PARENCHYMA WITH FIBROCYSTIC CHANGES. - FIBROADENOMA. - NO ATYPIA, HYPERPLASIA OR MALIGNANCY IDENTIFIED. - TWO BENIGN LYMPH NODES WITH NO TUMOR SEEN (0/2). 3. Breast, simple mastectomy, Right - INVASIVE (GRADE I) AND IN SITU (LOW GRADE) DUCTAL CARCINOMA WITH PAPILLARY FEATURES, SPANNING 1 of 4 FINAL for Lauren Farley, Lauren Farley [phone]) Diagnosis(continued) 0.6 CM IN GREATEST DIMENSION. - SEPARATE INTRADUCTAL PAPILLOMA WITH LOW DUCTAL CARCINOMA IN SITU, SPANNING 1.5 CM IN GREATEST DIMENSION. - DEFINITIVE LYMPH/VASCULAR INVASION IS NOT IDENTIFIED. - MARGINS ARE NEGATIVE. - ONE BENIGN LYMPH NODE WITH NO TUMOR SEEN (0/1). - SEE ONCOLOGY TEMPLATE. Microscopic Comment 3. BREAST, INVASIVE TUMOR, WITH LYMPH NODE SAMPLING Specimen, including laterality: Right breast with right sentinel lymph node sampling. Procedure: Right simple mastectomy with right sentinel lymph node  biopsy. Grade: I. Tubule formation: 3. Nuclear pleomorphism: 1. Mitotic: 1. Tumor size (glass slide measurement): 0.6 in greatest dimension, see below comment. Margins: Invasive, distance to closest margin: 0.9 cm (anterior margin). In-situ, distance to closest margin: 0.5 cm (anterior margin). Lymphovascular invasion: Not identified. Ductal carcinoma in situ: Yes. Grade: Low grade. Extensive intraductal component: No. Lobular neoplasia: No. Tumor focality: Invasive ductal carcinoma is unifocal. Treatment effect: Not applicable. Extent of tumor: Invasive tumor is confined to breast parenchyma; intraductal papilloma with associated ductal carcinoma in situ involves the nipple. Lymph nodes: # examined: Two on the right and two on the left. Lymph nodes with metastasis: 0. Breast prognostic profile: Performed on previous case, [id number]. Estrogen receptor: 100%,  positive. Progesterone receptor: 100%, positive. HER-2/neu: 1.37, no amplification. Ki-67: 6%, low. Non-neoplastic breast: Hyalinized fibroadenoma, fibrosis and fat necrosis. TNM: pT1b, pN0, MX. Comments: Within sections taken from the large ill-defined mass, there are three small areas identified which show tumor. The largest focus of invasive tumor is 0.6 cm in greatest dimension. As a large portion of the mass consists of fat necrosis and fat necrosis, the 0.6 cm invasive focus will be used as the largest dimension for tumor staging purposes. A smooth muscle myosin heavy chain, p63 and calponin stains are all performed on a single block with tumor present within the large ill-defined mass. The stains are negative in the invasive nests while highlighting a nest of in situ carcinoma, confirming the presence of invasive and in situ ductal carcinoma. A smooth muscle myosin, calponin and p63 stain are also performed on the intraductal papilloma with ductal carcinoma in situ. They demonstrate a preserved myoepithelial layer, confirming the lack of invasive tumor within this area. A HER-2 neu by CISH will be repeated and reported in an addendum. (RAH:eps 12/26/12) 2 of  RADIOGRAPHIC STUDIES:  No results found.  ASSESSMENT: 67 year old female with  #1 stage I (T1 N0) invasive ductal carcinoma of the right breast status post bilateral mastectomies with a right sentinel lymph node biopsy. Postoperatively patient is doing well.  #2 she will proceed with adjuvant therapy consisting of arimidex 1 mg daily. There is no role for radiation therapy. We discussed the risks and benefits of arimidex. She will need a bone density scan will set this up. Declined arimidex   PLAN:  #1 continue to follow every year  #2 she will proceed with reconstruction consultation with Dr. Odis Luster  All questions were answered. The patient knows to call the clinic with any problems, questions or concerns. We can  certainly see the patient much sooner if necessary.  I spent 15 minutes counseling the patient face to face. The total time spent in the appointment was 20 minutes.    Drue Second, MD Medical/Oncology [facility] [phone] (beeper) [phone] (Office)  07/15/2013, 11:12 AM

## 2013-07-15 NOTE — Telephone Encounter (Signed)
appts made and printed...td 

## 2013-07-21 ENCOUNTER — Ambulatory Visit (INDEPENDENT_AMBULATORY_CARE_PROVIDER_SITE_OTHER): Payer: Medicare Other | Admitting: General Surgery

## 2013-07-21 ENCOUNTER — Encounter (INDEPENDENT_AMBULATORY_CARE_PROVIDER_SITE_OTHER): Payer: Self-pay | Admitting: General Surgery

## 2013-07-21 VITALS — BP 128/84 | HR 68 | Temp 97.2°F | Resp 16 | Ht 66.0 in | Wt 220.6 lb

## 2013-07-21 DIAGNOSIS — Z853 Personal history of malignant neoplasm of breast: Secondary | ICD-10-CM

## 2013-07-21 NOTE — Patient Instructions (Signed)
Call if you find any new nodules on the chest wall.

## 2013-07-21 NOTE — Progress Notes (Signed)
Procedure:  Bilateral mastectomies and right axillary sentinel lymph node biopsy  Date:  12/24/2012  Pathology: T1N0  History:  She Is here for a long-term followup visit.  She has minimal discomfort in her chest wall area. She is interested in seeing a Engineer, petroleum to discuss reconstruction options. She denies any new masses on the chest wall. Exam: General- Is in NAD. Chest-Incisions are clean and intact.  There is less swelling present.  No firm chest wall nodules.  Assessment: T1N0 right breast cancer s/p bilateral mastectomies and right axillary SLNBx-There is no clinical evidence of recurrence. She is interested in plastic surgery referral to discuss reconstruction.  Plan:    Will schedule the plastic surgery referral. Return in 3 months.

## 2013-07-28 ENCOUNTER — Other Ambulatory Visit: Payer: Self-pay | Admitting: General Practice

## 2013-07-28 ENCOUNTER — Ambulatory Visit
Admission: RE | Admit: 2013-07-28 | Discharge: 2013-07-28 | Disposition: A | Discharge status: Home / Self Care | Payer: Medicare Other | Source: Ambulatory Visit | Attending: General Practice | Admitting: General Practice

## 2013-07-28 DIAGNOSIS — M79672 Pain in left foot: Secondary | ICD-10-CM

## 2013-09-02 ENCOUNTER — Encounter: Payer: Self-pay | Admitting: Internal Medicine

## 2013-09-22 ENCOUNTER — Ambulatory Visit: Payer: Medicare Other

## 2013-09-22 ENCOUNTER — Ambulatory Visit (INDEPENDENT_AMBULATORY_CARE_PROVIDER_SITE_OTHER): Payer: Medicare Other | Admitting: Emergency Medicine

## 2013-09-22 ENCOUNTER — Ambulatory Visit: Payer: Self-pay

## 2013-09-22 VITALS — BP 112/68 | HR 73 | Temp 97.9°F | Resp 18 | Ht 66.0 in | Wt 220.0 lb

## 2013-09-22 DIAGNOSIS — M79672 Pain in left foot: Secondary | ICD-10-CM

## 2013-09-22 DIAGNOSIS — M25562 Pain in left knee: Secondary | ICD-10-CM

## 2013-09-22 DIAGNOSIS — M25569 Pain in unspecified knee: Secondary | ICD-10-CM

## 2013-09-22 DIAGNOSIS — M79645 Pain in left finger(s): Secondary | ICD-10-CM

## 2013-09-22 DIAGNOSIS — M79609 Pain in unspecified limb: Secondary | ICD-10-CM

## 2013-09-22 MED ORDER — MELOXICAM 7.5 MG PO TABS: ORAL_TABLET | ORAL | Status: DC

## 2013-09-22 NOTE — Progress Notes (Addendum)
Subjective:    Patient ID: Lauren Farley, female    DOB: 25-Feb-1946, 67 y.o.   MRN: [id number]  This chart was scribed for Viviann Spare Andrew Soria-MD, by Ladona Ridgel Day, Scribe. This patient was seen in room 2 and the patient's care was started at 11:45 AM.  HPI Lauren Farley is a 67 y.o. female who presents to the [facility] complaining of left middle finger pain, left knee pain and left foot pain, no recent injury/trauma.   Finger: Of her left middle finger, she reports has been doing lots of crafts/handwork at home recently and over the past several days has increased w/pain to her finger and worsens w/ROM. She denies trauma/injury. She states in the morning, her finger is more stiff and painful, but improves somewhat throughout the day. She has not tried any medicines at home for this problem.   Left Knee: She reports hx of left knee problems and had arthroscopic surger by Dr. Lucendia Herrlich many years ago. She reports increased pain w/ROM, no recent injury. She has not tried any medicines at home for this problem.  Left foot: She reports aching left foot pain, which increases w/bearing weight or walking. She reports able to ambulate w/out difficulty but w/mild pain bearing weight. She has not tried any medicines at home for this problem. She denies recent injury/trauma.   Breast CA, bilateral mastectomy in march this year, now w/pain Past Medical History  Diagnosis Date  . Arthritis   . Back pain   . Hot flashes   . Cancer     Breast  . Thyroid disease     Graves disease s/p RAI treatment  . Hypothyroidism   . Shortness of breath     with exertion  . Headache(784.0)   . GERD (gastroesophageal reflux disease)     mygestic herbal  . Insomnia   . Constipation   . History of blood transfusion     vein stripping in 70's   Past Surgical History  Procedure Laterality Date  . Bunionectomy Bilateral     spurs  . Abdominal hysterectomy      TAH/BSO  . Varicose vein  surgery    . Knee arthroscopy      left  . Mastectomy modified radical Bilateral 12/24/2012    LYMPH NODE BIOPSY   . Simple mastectomy with axillary sentinel node biopsy Bilateral 12/24/2012    Procedure: BILATERAL MASTECTOMY;  Surgeon: Adolph Pollack, MD;  Location: [facility] OR;  Service: General;  Laterality: Bilateral;  . Axillary sentinel node biopsy Right 12/24/2012    Procedure: RIGHT AXILLARY SENTINEL LYMPH NODE BIOPSY;  Surgeon: Adolph Pollack, MD;  Location: [facility] OR;  Service: General;  Laterality: Right;  NUCLEAR MEDICINE INJECTION - RIGHT SIDE  . Breast surgery     Family History  Problem Relation Age of Onset  . Heart disease Sister   . Hypertension Sister    History   Social History  . Marital Status: Single    Spouse Name: N/A    Number of Children: N/A  . Years of Education: N/A   Occupational History  . Not on file.   Social History Main Topics  . Smoking status: Former Smoker -- 8 years  . Smokeless tobacco: Never Used     Comment: quit in th '80's  . Alcohol Use: Yes     Comment: 1-2 per week  . Drug Use: No  . Sexual Activity: Not Currently   Other Topics Concern  .  Not on file   Social History Narrative  . No narrative on file   No Known Allergies Patient Active Problem List   Diagnosis Date Noted  . 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 KNEE PAIN 02/26/2009  . ROTATOR CUFF SYNDROME 02/26/2009  . LOW BACK PAIN, CHRONIC 10/29/2008  . HYPOTHYROIDISM, POST-RADIOACTIVE IODINE 10/16/1988  . GRAVE'S DISEASE, HX OF 10/16/1978   No orders of the defined types were placed in this encounter.   BP 112/68  Pulse 73  Temp(Src) 97.9 F (36.6 C) (Oral)  Resp 18  Ht 5\' 6"  (1.676 m)  Wt 220 lb (99.791 kg)  BMI 35.53 kg/m2  SpO2 97%         Review of Systems  Musculoskeletal:       Left foot/knee and left middle finger pain       Objective:   Physical Exam patient  is tender in the base of the palm of the left hand she has pain with flexion against resistance. She is very limited and motion of the left middle finger. Examination of the left knee reveals significant tenderness over the medial joint space. There is no locking there is no instability of the knee cap. Examination of the left foot reveals this scar over the mid foot in the central portion. There is tenderness in the surgical scar site. There is no swelling of the ankles or the toes. [facility] reading (PRIMARY) by  Dr.Clarisse Rodriges films of the finger are normal. There degenerative changes around the left foot and left knee. There is calcification present over the mid foot at the site of previous surgery.         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 She also has evidence of a medial meniscus problem. I think the discomfort in her left foot is more arthritic related from her previous surgery.

## 2013-09-23 ENCOUNTER — Telehealth: Payer: Self-pay | Admitting: Radiology

## 2013-09-23 DIAGNOSIS — M25569 Pain in unspecified knee: Secondary | ICD-10-CM

## 2013-09-23 NOTE — Telephone Encounter (Signed)
Called her and future order placed for uric acid only, is this the only lab needed? She will come in for lab only

## 2013-09-23 NOTE — Telephone Encounter (Signed)
Message copied by Caffie Damme on Tue Sep 23, 2013 11:10 AM ------      Message from: Lesle Chris A      Created: Tue Sep 23, 2013 11:10 AM       X-rays were all reviewed and shows significant arthritis. The radiologist raised the question about gout. She should return to clinic at sometime in the near future and have her blood work done to check for gout. ------

## 2013-09-23 NOTE — Telephone Encounter (Signed)
Yes that is the only lab she needs

## 2013-10-06 ENCOUNTER — Telehealth (INDEPENDENT_AMBULATORY_CARE_PROVIDER_SITE_OTHER): Payer: Self-pay | Admitting: *Deleted

## 2013-10-06 NOTE — Telephone Encounter (Signed)
I spoke with pt because I called Dr. Odis Luster office and they state that she has still not scheduled an appt with them.  Pt was waiting on approval and a referral from her PCP due to her insurance and this requirement from Dr. Odis Luster office.  Pt states that she is wanting to wait until 1 year out from her surgery, I notified her that her initial visit would just be a consultation and it would be good to go ahead and get this scheduled.  She is agreeable and states she will call her PCP to get this done.

## 2013-10-24 ENCOUNTER — Ambulatory Visit (INDEPENDENT_AMBULATORY_CARE_PROVIDER_SITE_OTHER): Payer: Medicare Other | Admitting: General Surgery

## 2013-10-24 ENCOUNTER — Encounter (INDEPENDENT_AMBULATORY_CARE_PROVIDER_SITE_OTHER): Payer: Self-pay | Admitting: General Surgery

## 2013-10-24 VITALS — BP 118/76 | HR 76 | Temp 98.3°F | Resp 14 | Ht 66.0 in | Wt 222.6 lb

## 2013-10-24 DIAGNOSIS — C50911 Malignant neoplasm of unspecified site of right female breast: Secondary | ICD-10-CM

## 2013-10-24 DIAGNOSIS — C50919 Malignant neoplasm of unspecified site of unspecified female breast: Secondary | ICD-10-CM

## 2013-10-24 NOTE — Patient Instructions (Signed)
Call if you find any new lumps on your chest wall.

## 2013-10-24 NOTE — Progress Notes (Signed)
Procedure:  Bilateral mastectomies and right axillary sentinel lymph node biopsy  Date:  12/24/2012  Pathology: T1N0  History:  She Is here for another long-term followup visit.  She has been having trouble getting in to see the plastic surgeon. She is still working on that. She denies any new chest wall nodules or adenopathy. Exam: General- Is in NAD. Chest-Bilateral chest wall scars are noted. No firm chest wall nodules are noted.  Lymph nodes-no supraclavicular or axillary adenopathy.  Assessment: T1N0 right breast cancer s/p bilateral mastectomies and right axillary SLNBx-There is no clinical evidence of recurrence.  Plan:   We will help her get the referral to the plastic surgeon. Return visit 3 months.

## 2013-10-28 ENCOUNTER — Encounter: Payer: Self-pay | Admitting: Internal Medicine

## 2013-10-31 ENCOUNTER — Telehealth (INDEPENDENT_AMBULATORY_CARE_PROVIDER_SITE_OTHER): Payer: Self-pay

## 2013-10-31 NOTE — Telephone Encounter (Signed)
Angie will contact Medicaid and try to get prior approval for visit.  She will contact me when she has any new information.

## 2013-10-31 NOTE — Telephone Encounter (Signed)
Faxed a copy of pt's Medicaid card to Angie at Dr. Harlow Mares office.  She will let me know if it has the appropriate information.  Trying to get pt in to see Dr. Harlow Mares to discuss breast reconstruction.  Will make pt aware of this.

## 2013-11-04 ENCOUNTER — Telehealth (INDEPENDENT_AMBULATORY_CARE_PROVIDER_SITE_OTHER): Payer: Self-pay

## 2013-11-04 NOTE — Telephone Encounter (Signed)
Pt is scheduled to see plastic surgery, Dr. Harlow Mares, on 12/10/13 at 1:00pm.  The pt is aware.

## 2013-11-10 ENCOUNTER — Other Ambulatory Visit (INDEPENDENT_AMBULATORY_CARE_PROVIDER_SITE_OTHER): Payer: Self-pay | Admitting: *Deleted

## 2013-11-10 ENCOUNTER — Telehealth (INDEPENDENT_AMBULATORY_CARE_PROVIDER_SITE_OTHER): Payer: Self-pay

## 2013-11-10 MED ORDER — UNABLE TO FIND: Status: DC

## 2013-11-10 NOTE — Telephone Encounter (Signed)
Need to know what type of prosthesis pt will need for swimming.

## 2013-11-24 ENCOUNTER — Encounter: Payer: Self-pay | Admitting: Internal Medicine

## 2014-01-15 ENCOUNTER — Encounter: Payer: Self-pay | Admitting: Internal Medicine

## 2014-01-21 ENCOUNTER — Encounter (INDEPENDENT_AMBULATORY_CARE_PROVIDER_SITE_OTHER): Payer: Self-pay | Admitting: General Surgery

## 2014-01-21 ENCOUNTER — Ambulatory Visit (INDEPENDENT_AMBULATORY_CARE_PROVIDER_SITE_OTHER): Payer: Commercial Managed Care - HMO | Admitting: General Surgery

## 2014-01-21 VITALS — BP 120/80 | HR 75 | Temp 97.8°F | Resp 16 | Ht 66.0 in | Wt 215.0 lb

## 2014-01-21 DIAGNOSIS — C50919 Malignant neoplasm of unspecified site of unspecified female breast: Secondary | ICD-10-CM

## 2014-01-21 NOTE — Patient Instructions (Signed)
Continue your exercises and move up on them slowly.

## 2014-01-21 NOTE — Progress Notes (Signed)
Procedure:  Bilateral mastectomies and right axillary sentinel lymph node biopsy  Date:  12/24/2012  Pathology: T1N0  History:  She Is here for another long-term followup visit.  She denies any chest wall nodules or adenopathy. She saw Dr. Harlow Mares and was not comfortable with that situation and does not want to proceed with any reconstructive surgery at this time. She's started some exercises and has some soreness. Exam: General- Is in NAD. Chest-Bilateral chest wall scars are noted. No firm chest wall nodules are noted.  Small abrasion noted in the right lateral chest wall.  Lymph nodes-no supraclavicular or axillary adenopathy.  Assessment: T1N0 right breast cancer s/p bilateral mastectomies and right axillary SLNBx-There is no clinical evidence of recurrence.  Plan:   Continue to do the exercises. Return visit 3 months. When she decides she would like to have reconstruction, we can refer her to another plastic surgeon.

## 2014-04-15 ENCOUNTER — Ambulatory Visit (INDEPENDENT_AMBULATORY_CARE_PROVIDER_SITE_OTHER): Payer: Commercial Managed Care - HMO | Admitting: General Surgery

## 2014-04-15 VITALS — BP 120/80 | HR 76 | Resp 20 | Ht 67.0 in | Wt 220.6 lb

## 2014-04-15 DIAGNOSIS — C50919 Malignant neoplasm of unspecified site of unspecified female breast: Secondary | ICD-10-CM

## 2014-04-15 NOTE — Progress Notes (Signed)
Procedure:  Bilateral mastectomies and right axillary sentinel lymph node biopsy  Date:  12/24/2012  Pathology: T1N0 right breast cancer  History:  She Is here for another long-term followup visit.  She denies any chest wall nodules or adenopathy.  Her biggest complaint is pain she is having in both of her hands. She is seeing a physician about this later in the month. She would like to see a different plastic surgeon to talk about removal of some of the excess skin from the mastectomy sites. Exam: General- Is in NAD. Chest-Bilateral chest wall scars are noted. No firm chest wall nodules are noted. Excess skin and subcutaneous tissue noted in the lateral aspect of both chest walls. Lymph nodes-no supraclavicular or axillary adenopathy.  Assessment: T1N0 right breast cancer s/p bilateral mastectomies and right axillary SLNBx-There is no clinical evidence of recurrence.  Plan:   Will refer her to a Set designer. Return visit 3 months.

## 2014-04-15 NOTE — Patient Instructions (Signed)
Call if you find any new lumps in your breasts or chest wall.  We will make a referral to a different Plastic Surgeon for you.

## 2014-04-16 IMAGING — CR DG FOOT COMPLETE 3+V*L*
3 series · 3 of 3 positions shown · non-contrast
Comparison: None.

CLINICAL DATA: Left foot pain.

EXAM:
LEFT FOOT - COMPLETE 3+ VIEW

[t foot ap left]
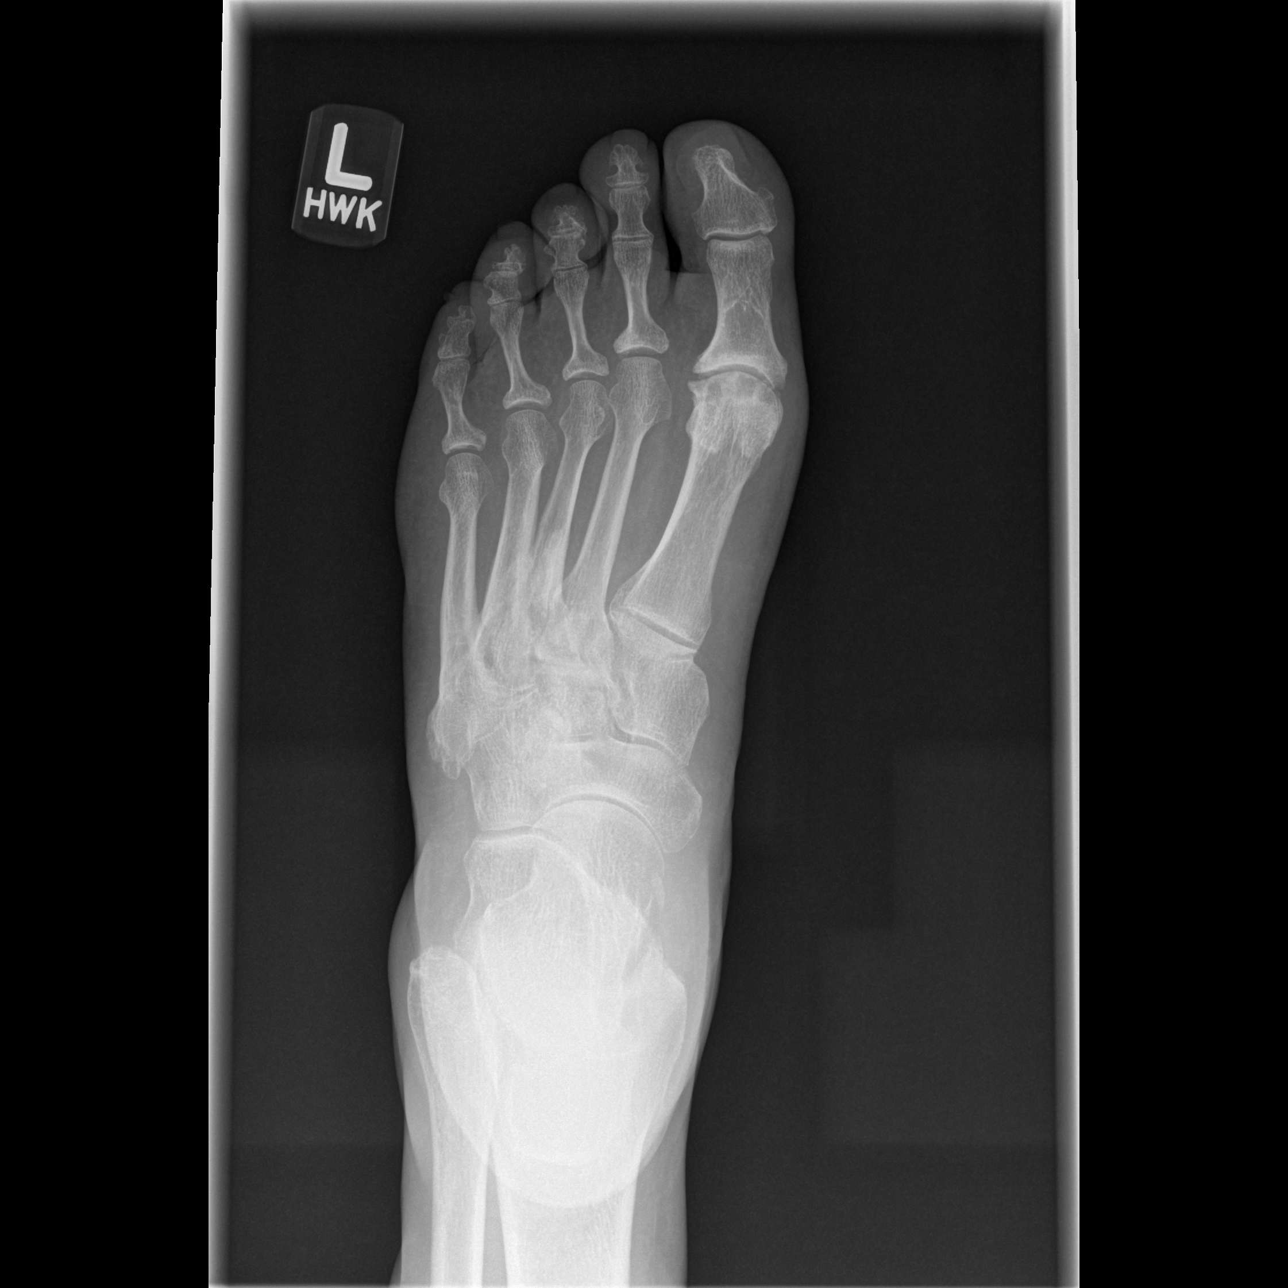

[t foot oblique left]
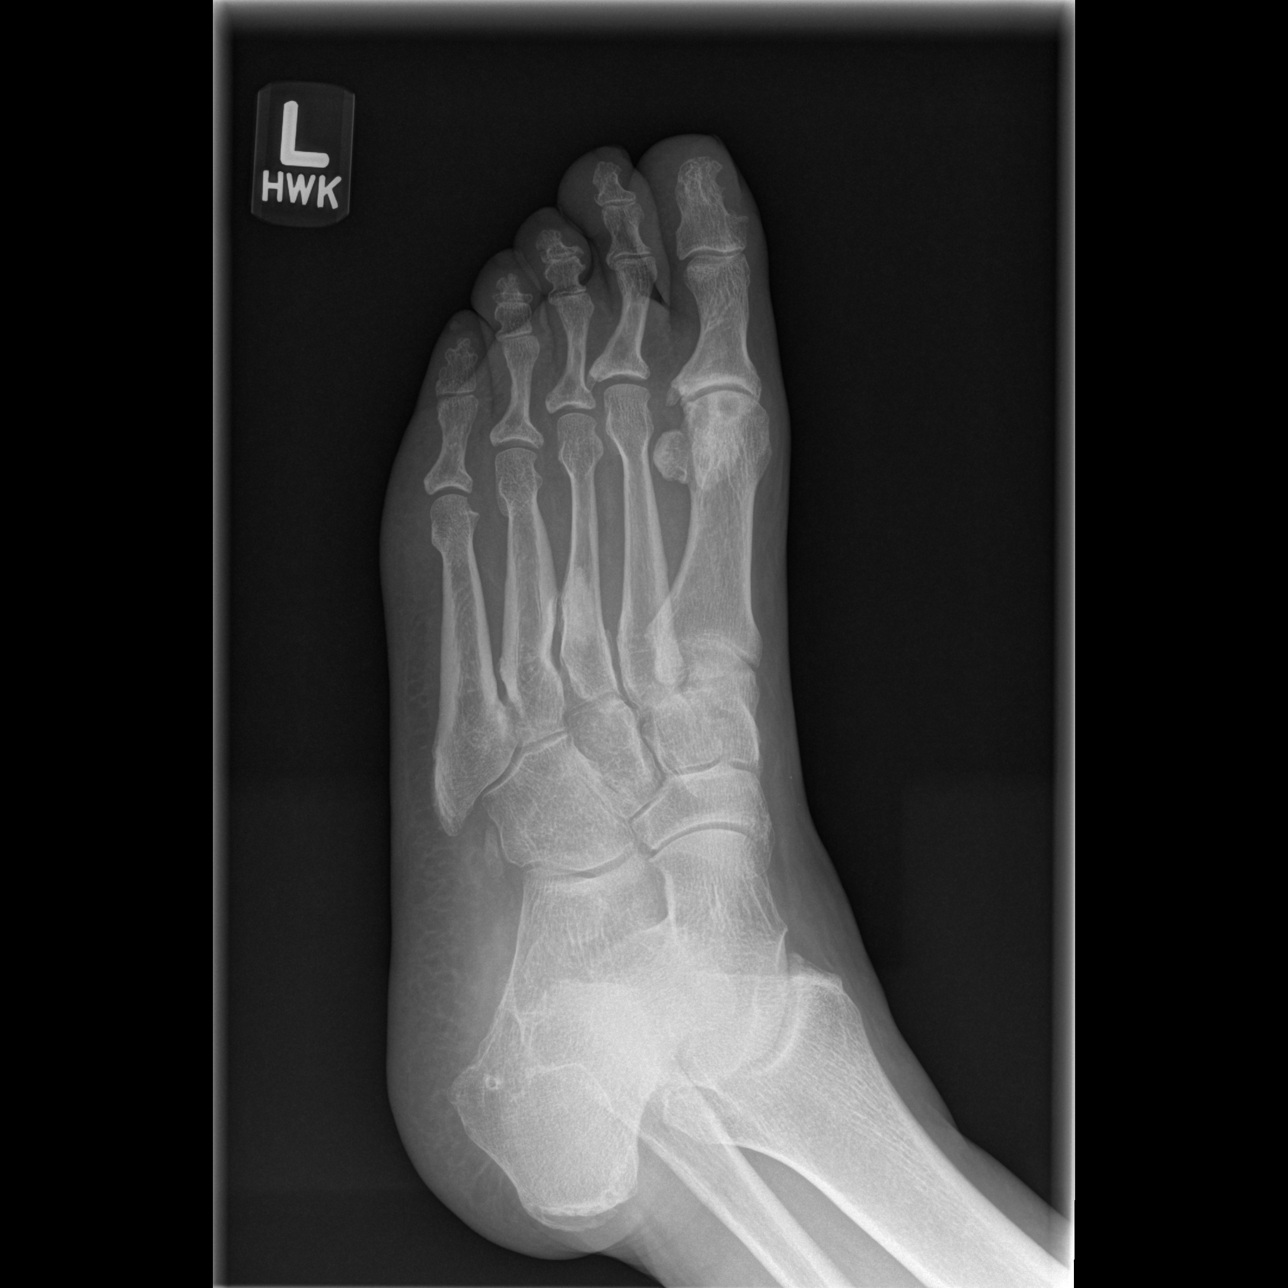

[t foot lat left]
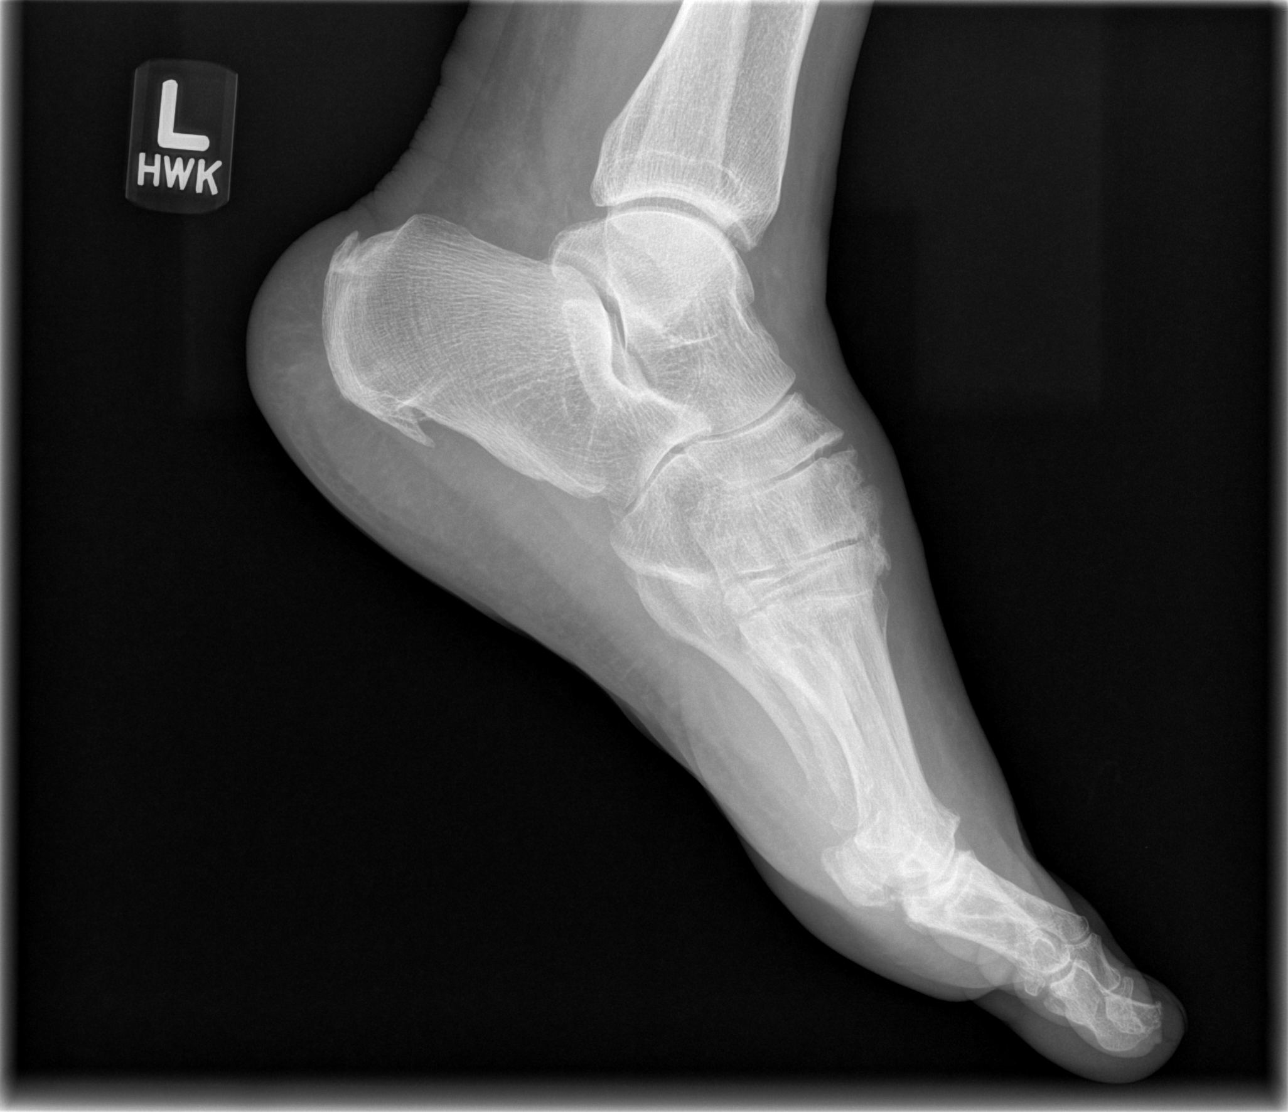

[3 of 3 positions shown; findings below may reference images not displayed]

FINDINGS: Degenerative changes in the 1st MTP joint with joint space
narrowing, spurring and subchondral cyst formation. Degenerative
changes in the midfoot. Sclerosis noted within the scratch has
sclerosis and periosteal reaction noted within the mid portion of
the left 3rd metatarsal, likely healing fracture. No additional
acute bony abnormality.
IMPRESSION: Healing fracture within the left 3rd metatarsal.

## 2014-04-17 ENCOUNTER — Ambulatory Visit: Payer: Medicare Other | Admitting: Family Medicine

## 2014-04-24 ENCOUNTER — Encounter: Payer: Self-pay | Admitting: Family Medicine

## 2014-04-24 ENCOUNTER — Ambulatory Visit (INDEPENDENT_AMBULATORY_CARE_PROVIDER_SITE_OTHER): Payer: Commercial Managed Care - HMO | Admitting: Family Medicine

## 2014-04-24 VITALS — BP 114/81 | HR 76 | Temp 98.6°F | Resp 18 | Ht 66.0 in | Wt 219.0 lb

## 2014-04-24 DIAGNOSIS — G5603 Carpal tunnel syndrome, bilateral upper limbs: Secondary | ICD-10-CM

## 2014-04-24 DIAGNOSIS — M6530 Trigger finger, unspecified finger: Secondary | ICD-10-CM

## 2014-04-24 DIAGNOSIS — M19042 Primary osteoarthritis, left hand: Secondary | ICD-10-CM

## 2014-04-24 DIAGNOSIS — M79643 Pain in unspecified hand: Secondary | ICD-10-CM

## 2014-04-24 DIAGNOSIS — M25562 Pain in left knee: Secondary | ICD-10-CM

## 2014-04-24 DIAGNOSIS — M19041 Primary osteoarthritis, right hand: Secondary | ICD-10-CM

## 2014-04-24 DIAGNOSIS — G5600 Carpal tunnel syndrome, unspecified upper limb: Secondary | ICD-10-CM

## 2014-04-24 DIAGNOSIS — M25549 Pain in joints of unspecified hand: Secondary | ICD-10-CM

## 2014-04-24 DIAGNOSIS — M19049 Primary osteoarthritis, unspecified hand: Secondary | ICD-10-CM

## 2014-04-24 DIAGNOSIS — M25569 Pain in unspecified knee: Secondary | ICD-10-CM

## 2014-04-24 DIAGNOSIS — I10 Essential (primary) hypertension: Secondary | ICD-10-CM

## 2014-04-24 DIAGNOSIS — E039 Hypothyroidism, unspecified: Secondary | ICD-10-CM

## 2014-04-24 LAB — CBC WITH DIFFERENTIAL/PLATELET
BASOS PCT: 1 % (ref 0–1)
Basophils Absolute: 0.1 10*3/uL (ref 0.0–0.1)
Eosinophils Absolute: 0.2 10*3/uL (ref 0.0–0.7)
Eosinophils Relative: 3 % (ref 0–5)
HCT: 41.5 % (ref 36.0–46.0)
Hemoglobin: 14 g/dL (ref 12.0–15.0)
Lymphocytes Relative: 38 % (ref 12–46)
Lymphs Abs: 2.1 10*3/uL (ref 0.7–4.0)
MCH: 29.2 pg (ref 26.0–34.0)
MCHC: 33.7 g/dL (ref 30.0–36.0)
MCV: 86.6 fL (ref 78.0–100.0)
Monocytes Absolute: 0.6 10*3/uL (ref 0.1–1.0)
Monocytes Relative: 10 % (ref 3–12)
NEUTROS ABS: 2.7 10*3/uL (ref 1.7–7.7)
NEUTROS PCT: 48 % (ref 43–77)
Platelets: 221 10*3/uL (ref 150–400)
RBC: 4.79 MIL/uL (ref 3.87–5.11)
RDW: 14.8 % (ref 11.5–15.5)
WBC: 5.6 10*3/uL (ref 4.0–10.5)

## 2014-04-24 LAB — COMPLETE METABOLIC PANEL WITH GFR
ALT: 14 U/L (ref 0–35)
AST: 17 U/L (ref 0–37)
Albumin: 4.2 g/dL (ref 3.5–5.2)
Alkaline Phosphatase: 124 U/L — ABNORMAL HIGH (ref 39–117)
BUN: 11 mg/dL (ref 6–23)
CO2: 27 meq/L (ref 19–32)
CREATININE: 0.88 mg/dL (ref 0.50–1.10)
Calcium: 9.4 mg/dL (ref 8.4–10.5)
Chloride: 105 mEq/L (ref 96–112)
GFR, EST AFRICAN AMERICAN: 80 mL/min
GFR, EST NON AFRICAN AMERICAN: 69 mL/min
GLUCOSE: 73 mg/dL (ref 70–99)
Potassium: 3.9 mEq/L (ref 3.5–5.3)
SODIUM: 140 meq/L (ref 135–145)
TOTAL PROTEIN: 6.6 g/dL (ref 6.0–8.3)
Total Bilirubin: 0.5 mg/dL (ref 0.2–1.2)

## 2014-04-24 LAB — LIPID PANEL
Cholesterol: 164 mg/dL (ref 0–200)
HDL: 41 mg/dL (ref >39)
LDL Cholesterol: 107 mg/dL — ABNORMAL HIGH (ref 0–99)
Total CHOL/HDL Ratio: 4 Ratio
Triglycerides: 80 mg/dL (ref <150)
VLDL: 16 mg/dL (ref 0–40)

## 2014-04-24 LAB — C-REACTIVE PROTEIN: CRP: 2.1 mg/dL — AB (ref <0.60)

## 2014-04-24 LAB — RHEUMATOID FACTOR: Rhuematoid fact SerPl-aCnc: <10 IU/mL (ref <=14)

## 2014-04-24 MED ORDER — LEVOTHYROXINE SODIUM 112 MCG PO TABS: 112.0000 ug | ORAL_TABLET | Freq: Every day | ORAL | Status: DC

## 2014-04-24 MED ORDER — MELOXICAM 7.5 MG PO TABS: ORAL_TABLET | ORAL | Status: DC

## 2014-04-24 NOTE — Progress Notes (Addendum)
Subjective:  This chart was scribed for [name], MD by Roxan Diesel, Scribe.  This patient was seen in [place] [facility] and the patient's care was started at 1:29 PM.   Patient ID: Lauren Farley, female    DOB: 10-01-1946, 68 y.o.   MRN: [id number]  Chief Complaint  Patient presents with  . Establish Care    HPI  HPI Comments: Burnice Budde is a 68 y.o. female who presents to [facility] to establish care.  Pt was seen by the PA of Dr. Bryon Lions although she was only seen there twice.  Previously she had been seen here a few times.  Hand pain:  She complains of chronic severe stabbing and throbbing pain in the palms of her hands with some weakness, and swelling to the knuckles.  She states pain and stiffness is worst when she wakes up in the morning and improves through the day.  She has to put warm water on her hands to make them less stiff.  Pain is equal in both hands but radiates only up the right arm.  She states she is dropping things.  She sometimes wears compression sleeves on both hands.  She does not wear splints at night.  Pain is worse with driving although she was able to drive herself here.  Pt was also seen for this at [facility] and thought she was going to see a neurologist for possible carpal tunnel but she never got the referral.  She used to be on Meloxicam which helped when she was on it.  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  Left knee: She had arthroscopic surgery to the knee 5-6 years ago at [facility].  She initially had a brace on the knee and left ankle but is not wearing it any more.  She reports continued pain to the left knee that is worse with walking.  H/o breast cancer: She had bilateral mastectomies in February 2014.  She is doing well in regard to this.  Hypothyroidism: She is on levothyroxine 112 for the past year.  She is fasting today.   Patient Active Problem List   Diagnosis Date Noted  . Cancer of upper-outer  quadrant of female breast s/p bilateral mastectomies and right SLNBx on 12/24/12 11/26/2012  . KNEE PAIN 02/26/2009  . ROTATOR CUFF SYNDROME 02/26/2009  . LOW BACK PAIN, CHRONIC 10/29/2008  . HYPOTHYROIDISM, POST-RADIOACTIVE IODINE 10/16/1988  . GRAVE'S DISEASE, HX OF 10/16/1978    Past Medical History  Diagnosis Date  . Arthritis   . Back pain   . Hot flashes   . Cancer     Breast  . Thyroid disease     Graves disease s/p RAI treatment  . Hypothyroidism   . Shortness of breath     with exertion  . Headache(784.0)   . GERD (gastroesophageal reflux disease)     mygestic herbal  . Insomnia   . Constipation   . History of blood transfusion     vein stripping in 70's    Family History  Problem Relation Age of Onset  . Heart disease Sister   . Hypertension Sister     Past Surgical History  Procedure Laterality Date  . Bunionectomy Bilateral     spurs  . Abdominal hysterectomy      TAH/BSO  . Varicose vein surgery    . Knee arthroscopy      left  . Mastectomy modified radical Bilateral 12/24/2012    LYMPH  NODE BIOPSY   . Simple mastectomy with axillary sentinel node biopsy Bilateral 12/24/2012    Procedure: BILATERAL MASTECTOMY;  Surgeon: Odis Hollingshead, MD;  Location: [place];  Service: General;  Laterality: Bilateral;  . Axillary sentinel node biopsy Right 12/24/2012    Procedure: RIGHT AXILLARY SENTINEL LYMPH NODE BIOPSY;  Surgeon: Odis Hollingshead, MD;  Location: [place];  Service: General;  Laterality: Right;  NUCLEAR MEDICINE INJECTION - RIGHT SIDE  . Breast surgery      Current Outpatient Prescriptions on File Prior to Visit  Medication Sig Dispense Refill  . B Complex-C (B-COMPLEX WITH VITAMIN C) tablet Take 1 tablet by mouth daily.      . Cholecalciferol (VITAMIN D3) 2000 UNITS TABS Take 1 tablet by mouth daily.      . Elastic Bandages & Supports (ELASTIC BANDAGE 2") MISC       . levothyroxine (SYNTHROID, LEVOTHROID) 125 MCG tablet Take 125 mcg by mouth daily.       Marland Kitchen UNABLE TO FIND Rx: L8000- Post Surgical Bra (Quantity: 6) [id number]- Silicone Breast Prosthesis (Quantity: 2) Dx: 174.9; Bilateral mastectomy  1 each  0  . Benzocaine-Menthol 3-3 MG STRP       . HYDROcodone-acetaminophen (NORCO/VICODIN) 5-325 MG per tablet Take 1 tablet by mouth every 6 (six) hours as needed for moderate pain.      . meloxicam (MOBIC) 7.5 MG tablet Take 1-2 tablets daily for joint pain  30 tablet  1   No current facility-administered medications on file prior to visit.    No Known Allergies  History   Social History  . Marital Status: Single    Spouse Name: N/A    Number of Children: N/A  . Years of Education: N/A   Occupational History  . Not on file.   Social History Main Topics  . Smoking status: Former Smoker -- 8 years  . Smokeless tobacco: Never Used     Comment: quit in th '80's  . Alcohol Use: Yes     Comment: 1-2 per week  . Drug Use: No  . Sexual Activity: Not Currently   Other Topics Concern  . Not on file   Social History Narrative  . No narrative on file     Review of Systems  Musculoskeletal: Positive for arthralgias.  Neurological: Positive for weakness.     BP 114/81  Pulse 76  Temp(Src) 98.6 F (37 C) (Oral)  Resp 18  Ht 5\' 6"  (1.676 m)  Wt 219 lb (99.338 kg)  BMI 35.36 kg/m2  SpO2 94%     Objective:   Physical Exam  Nursing note and vitals reviewed. Constitutional: She is oriented to person, place, and time. She appears well-developed and well-nourished. No distress.  HENT:  Head: Normocephalic and atraumatic.  Eyes: Conjunctivae and EOM are normal.  Neck: Neck supple. No tracheal deviation present.  Cardiovascular: Normal rate.   Pulmonary/Chest: Effort normal. No respiratory distress.  Musculoskeletal:  Excellent thenar strength bilaterally Decreased flexion and full extension on left 3rd finger, with tenderness on the palmar aspect of 3rd MCP.  Neurological: She is alert and oriented to person, place, and  time.  Positive Tinel's sign, left worse than right Mildly positive Phalen's sign, with a negative reverse Phalen's  Skin: Skin is warm and dry.  Psychiatric: She has a normal mood and affect. Her behavior is normal.       Assessment & Plan:   Pain in joint, lower leg, left - Plan: C-reactive protein,  Sedimentation rate, CBC with Differential, TSH, T3, free, T4, free, COMPLETE METABOLIC PANEL WITH GFR, Lipid panel, ANA, Rheumatoid factor, Ambulatory referral to Physical Therapy  Hand joint pain, unspecified laterality - Plan: C-reactive protein, Sedimentation rate, CBC with Differential, TSH, T3, free, T4, free, COMPLETE METABOLIC PANEL WITH GFR, Lipid panel, ANA, Rheumatoid factor, Ambulatory referral to Physical Therapy  Essential hypertension, benign - Plan: CBC with Differential, COMPLETE METABOLIC PANEL WITH GFR  Unspecified hypothyroidism - Plan: TSH, T3, free, T4, free  ADDENDUM:  TSH high w/ low t3 on labs - increase levothyroxine back to 125 from 112 - recheck in 3 mos.  Bilateral carpal tunnel syndrome - Plan: Ambulatory referral to Physical Therapy - start wearing qhs cock-up wrist splints. Pt w/ needle phobia so really wants to avoid NCV/EMG. Try PT but if not improvement with this may need hand surg eval which pt is very reluctant to do as definitely does not want cortisone inj or surgery.  Trigger finger, acquired - Plan: Ambulatory referral to Physical Therapy  Primary osteoarthritis of both hands - Plan: Ambulatory referral to Physical Therapy  Meds ordered this encounter  Medications  . meloxicam (MOBIC) 7.5 MG tablet    Sig: Take 1-2 tablets daily for joint pain    Dispense:  30 tablet    Refill:  5  . levothyroxine (SYNTHROID, LEVOTHROID) 112 MCG tablet    Sig: Take 1 tablet (112 mcg total) by mouth daily.    Dispense:  90 tablet    Refill:  1    I personally performed the services described in this documentation, which was scribed in my presence. The recorded  information has been reviewed and considered, and addended by me as needed.  [name], MD MPH

## 2014-04-24 NOTE — Patient Instructions (Signed)
Wear your wrist braces every night.  I will be happy to refer you to PT to see if they can help.  Use the meloxicam as needed.    If your symptoms worsen, the next step will likely be to see a hand surgeon. They may want to do a nerve test to confirm carpal tunnel.  Make an appointment for a full physical and to evaluate your vaginal polyp.  Carpal Tunnel Syndrome The carpal tunnel is a narrow area located on the palm side of your wrist. The tunnel is formed by the wrist bones and ligaments. Nerves, blood vessels, and tendons pass through the carpal tunnel. Repeated wrist motion or certain diseases may cause swelling within the tunnel. This swelling pinches the main nerve in the wrist (median nerve) and causes the painful hand and arm condition called carpal tunnel syndrome. CAUSES   Repeated wrist motions.  Wrist injuries.  Certain diseases like arthritis, diabetes, alcoholism, hyperthyroidism, and kidney failure.  Obesity.  Pregnancy. SYMPTOMS   A "pins and needles" feeling in your fingers or hand.  Tingling or numbness in your fingers or hand.  An aching feeling in your entire arm.  Wrist pain that goes up your arm to your shoulder.  Pain that goes down into your palm or fingers.  A weak feeling in your hands. DIAGNOSIS  Your caregiver will take your history and perform a physical exam. An electromyography test may be needed. This test measures electrical signals sent out by the muscles. The electrical signals are usually slowed by carpal tunnel syndrome. You may also need X-rays. TREATMENT  Carpal tunnel syndrome may clear up by itself. Your caregiver may recommend a wrist splint or medicine such as a nonsteroidal anti-inflammatory medicine. Cortisone injections may help. Sometimes, surgery may be needed to free the pinched nerve.  HOME CARE INSTRUCTIONS   Take all medicine as directed by your caregiver. Only take over-the-counter or prescription medicines for pain,  discomfort, or fever as directed by your caregiver.  If you were given a splint to keep your wrist from bending, wear it as directed. It is important to wear the splint at night. Wear the splint for as long as you have pain or numbness in your hand, arm, or wrist. This may take 1 to 2 months.  Rest your wrist from any activity that may be causing your pain. If your symptoms are work-related, you may need to talk to your employer about changing to a job that does not require using your wrist.  Put ice on your wrist after long periods of wrist activity.  Put ice in a plastic bag.  Place a towel between your skin and the bag.  Leave the ice on for 15-20 minutes, 03-04 times a day.  Keep all follow-up visits as directed by your caregiver. This includes any orthopedic referrals, physical therapy, and rehabilitation. Any delay in getting necessary care could result in a delay or failure of your condition to heal. SEEK IMMEDIATE MEDICAL CARE IF:   You have new, unexplained symptoms.  Your symptoms get worse and are not helped or controlled with medicines. MAKE SURE YOU:   Understand these instructions.  Will watch your condition.  Will get help right away if you are not doing well or get worse. Document Released: 09/29/2000 Document Revised: 12/25/2011 Document Reviewed: 08/18/2011 [facility] Patient Information 2015 [place], [place]. This information is not intended to replace advice given to you by your health care provider. Make sure you discuss any questions you  have with your health care provider. Osteoarthritis Osteoarthritis is a disease that causes soreness and swelling (inflammation) of a joint. It occurs when the cartilage at the affected joint wears down. Cartilage acts as a cushion, covering the ends of bones where they meet to form a joint. Osteoarthritis is the most common form of arthritis. It often occurs in older people. The joints affected most often by this condition include  those in the:  Ends of the fingers.  Thumbs.  Neck.  Lower back.  Knees.  Hips. CAUSES  Over time, the cartilage that covers the ends of bones begins to wear away. This causes bone to rub on bone, producing pain and stiffness in the affected joints.  RISK FACTORS Certain factors can increase your chances of having osteoarthritis, including:  Older age.  Excessive body weight.  Overuse of joints. SIGNS AND SYMPTOMS   Pain, swelling, and stiffness in the joint.  Over time, the joint may lose its normal shape.  Small deposits of bone (osteophytes) may grow on the edges of the joint.  Bits of bone or cartilage can break off and float inside the joint space. This may cause more pain and damage. DIAGNOSIS  Your health care provider will do a physical exam and ask about your symptoms. Various tests may be ordered, such as:  X-rays of the affected joint.  An MRI scan.  Blood tests to rule out other types of arthritis.  Joint fluid tests. This involves using a needle to draw fluid from the joint and examining the fluid under a microscope. TREATMENT  Goals of treatment are to control pain and improve joint function. Treatment plans may include:  A prescribed exercise program that allows for rest and joint relief.  A weight control plan.  Pain relief techniques, such as:  Properly applied heat and cold.  Electric pulses delivered to nerve endings under the skin (transcutaneous electrical nerve stimulation, TENS).  Massage.  Certain nutritional supplements.  Medicines to control pain, such as:  Acetaminophen.  Nonsteroidal anti-inflammatory drugs (NSAIDs), such as naproxen.  Narcotic or central-acting agents, such as tramadol.  Corticosteroids. These can be given orally or as an injection.  Surgery to reposition the bones and relieve pain (osteotomy) or to remove loose pieces of bone and cartilage. Joint replacement may be needed in advanced states of  osteoarthritis. HOME CARE INSTRUCTIONS   Only take over-the-counter or prescription medicines as directed by your health care provider. Take all medicines exactly as instructed.  Maintain a healthy weight. Follow your health care provider's instructions for weight control. This may include dietary instructions.  Exercise as directed. Your health care provider can recommend specific types of exercise. These may include:  Strengthening exercises--These are done to strengthen the muscles that support joints affected by arthritis. They can be performed with weights or with exercise bands to add resistance.  Aerobic activities--These are exercises, such as brisk walking or low-impact aerobics, that get your heart pumping.  Range-of-motion activities--These keep your joints limber.  Balance and agility exercises--These help you maintain daily living skills.  Rest your affected joints as directed by your health care provider.  Follow up with your health care provider as directed. SEEK MEDICAL CARE IF:   Your skin turns red.  You develop a rash in addition to your joint pain.  You have worsening joint pain. SEEK IMMEDIATE MEDICAL CARE IF:  You have a significant loss of weight or appetite.  You have a fever along with joint or muscle aches.  You have night sweats. [place] of Arthritis and Musculoskeletal and Skin Diseases: www.niams.SouthExposed.es [employer] on Aging: http://kim-miller.com/ American College of Rheumatology: www.rheumatology.org Document Released: 10/02/2005 Document Revised: 07/23/2013 Document Reviewed: 06/09/2013 [facility] Patient Information 2015 [place], [place]. This information is not intended to replace advice given to you by your health care provider. Make sure you discuss any questions you have with your health care provider. Trigger Finger Trigger finger (digital tendinitis and stenosing tenosynovitis) is a common disorder that  causes an often painful catching of the fingers or thumb. It occurs as a clicking, snapping, or locking of a finger in the palm of the hand. This is caused by a problem with the tendons that flex or bend the fingers sliding smoothly through their sheaths. The condition may occur in any finger or a couple fingers at the same time.  The finger may lock with the finger curled or suddenly straighten out with a snap. This is more common in patients with rheumatoid arthritis and diabetes. Left untreated, the condition may get worse to the point where the finger becomes locked in flexion, like making a fist, or less commonly locked with the finger straightened out. CAUSES   Inflammation and scarring that lead to swelling around the tendon sheath.  Repeated or forceful movements.  Rheumatoid arthritis, an autoimmune disease that affects joints.  Gout.  Diabetes mellitus. SIGNS AND SYMPTOMS  Soreness and swelling of your finger.  A painful clicking or snapping as you bend and straighten your finger. DIAGNOSIS  Your health care provider will do a physical exam of your finger to diagnose trigger finger. TREATMENT   Splinting for 6-8 weeks may be helpful.  Nonsteroidal anti-inflammatory medicines (NSAIDs) can help to relieve the pain and inflammation.  Cortisone injections, along with splinting, may speed up recovery. Several injections may be required. Cortisone may give relief after one injection.  Surgery is another treatment that may be used if conservative treatments do not work. Surgery can be minor, without incisions (a cut does not have to be made), and can be done with a needle through the skin.  Other surgical choices involve an open procedure in which the surgeon opens the hand through a small incision and cuts the pulley so the tendon can again slide smoothly. Your hand will still work fine. HOME CARE INSTRUCTIONS  Apply ice to the injured area, twice per day:  Put ice in a plastic  bag.  Place a towel between your skin and the bag.  Leave the ice on for 20 minutes, 3-4 times a day.  Rest your hand often. MAKE SURE YOU:   Understand these instructions.  Will watch your condition.  Will get help right away if you are not doing well or get worse. Document Released: 07/22/2004 Document Revised: 06/04/2013 Document Reviewed: 03/04/2013 [facility] Patient Information 2015 [place], [place]. This information is not intended to replace advice given to you by your health care provider. Make sure you discuss any questions you have with your health care provider.

## 2014-04-25 LAB — T4, FREE: Free T4: 1.07 ng/dL (ref 0.80–1.80)

## 2014-04-25 LAB — TSH: TSH: 7.643 u[IU]/mL — AB (ref 0.350–4.500)

## 2014-04-25 LAB — T3, FREE: T3 FREE: 2.2 pg/mL — AB (ref 2.3–4.2)

## 2014-04-25 LAB — SEDIMENTATION RATE: SED RATE: 5 mm/h (ref 0–22)

## 2014-04-26 MED ORDER — LEVOTHYROXINE SODIUM 125 MCG PO TABS: 125.0000 ug | ORAL_TABLET | Freq: Every day | ORAL | Status: DC

## 2014-04-27 ENCOUNTER — Encounter: Payer: Self-pay | Admitting: *Deleted

## 2014-04-27 LAB — ANA: ANA: NEGATIVE

## 2014-05-03 ENCOUNTER — Encounter: Payer: Self-pay | Admitting: Family Medicine

## 2014-06-05 ENCOUNTER — Encounter: Payer: Self-pay | Admitting: Family Medicine

## 2014-06-05 NOTE — Progress Notes (Signed)
   Subjective:    Patient ID: Lauren Farley, female    DOB: [birth year], 68 y.o.   MRN: [id number] This chart was scribed for Shawnee Knapp, MD by Rosary Lively, ED scribe. This patient was seen in room Room/bed 24 and the patient's care was started at 12:06 PM.  Chief Complaint  Patient presents with  . Annual Exam    w/pap    HPI  HPI Comments:  Lauren Farley is a 68 y.o. female who presents to [facility] for an annual exam.  Pt has a h/o of breast cancer and had a bilateral mastectomy in 11/2012. She was on levothyroxine 112 for hypothyroidism and increased to 125 six weeks ago. Pt was referred to physicaly theropy and advised to wear wrist splints due to suspected carpel tunnel. Pt has a severe needle phobia which prohibits [place] from determining for sure. Pt refused cortisone shot for pain. Pt placed on trial of mobic for joint pain. Complete lab work up done at prior office visit and was relatively unremarkable.     Past Medical History  Diagnosis Date  . Arthritis   . Back pain   . Hot flashes   . Cancer     Breast  . Thyroid disease     Graves disease s/p RAI treatment  . Hypothyroidism   . Shortness of breath     with exertion  . Headache(784.0)   . GERD (gastroesophageal reflux disease)     mygestic herbal  . Insomnia   . Constipation   . History of blood transfusion     vein stripping in 70's   Current Outpatient Prescriptions on File Prior to Visit  Medication Sig Dispense Refill  . B Complex-C (B-COMPLEX WITH VITAMIN C) tablet Take 1 tablet by mouth daily.      . Cholecalciferol (VITAMIN D3) 2000 UNITS TABS Take 1 tablet by mouth daily.      Marland Kitchen levothyroxine (SYNTHROID, LEVOTHROID) 125 MCG tablet Take 1 tablet (125 mcg total) by mouth daily before breakfast.  90 tablet  1  . UNABLE TO FIND Rx: L8000- Post Surgical Bra (Quantity: 6) [id number]- Silicone Breast Prosthesis (Quantity: 2) Dx: 174.9; Bilateral mastectomy  1 each  0  . Elastic Bandages & Supports (ELASTIC  BANDAGE 2") MISC       . meloxicam (MOBIC) 7.5 MG tablet Take 1-2 tablets daily for joint pain  30 tablet  5   No current facility-administered medications on file prior to visit.   No Known Allergies  Review of Systems     Objective:   Physical Exam        Assessment & Plan:  EROUNEOUS ENCOUNTER - PT LEFT BEFORE SHE WAS SEEN BY ANY PROVIDER TODAY This encounter was created in error - please disregard.

## 2014-07-03 ENCOUNTER — Encounter: Payer: Self-pay | Admitting: Family Medicine

## 2014-07-03 ENCOUNTER — Ambulatory Visit (INDEPENDENT_AMBULATORY_CARE_PROVIDER_SITE_OTHER): Payer: Commercial Managed Care - HMO | Admitting: Family Medicine

## 2014-07-03 VITALS — BP 112/70 | HR 100 | Temp 98.3°F | Resp 16 | Ht 66.5 in | Wt 215.0 lb

## 2014-07-03 DIAGNOSIS — Z1211 Encounter for screening for malignant neoplasm of colon: Secondary | ICD-10-CM

## 2014-07-03 DIAGNOSIS — A499 Bacterial infection, unspecified: Secondary | ICD-10-CM

## 2014-07-03 DIAGNOSIS — Z79899 Other long term (current) drug therapy: Secondary | ICD-10-CM

## 2014-07-03 DIAGNOSIS — M25569 Pain in unspecified knee: Secondary | ICD-10-CM

## 2014-07-03 DIAGNOSIS — H6123 Impacted cerumen, bilateral: Secondary | ICD-10-CM

## 2014-07-03 DIAGNOSIS — G8929 Other chronic pain: Secondary | ICD-10-CM

## 2014-07-03 DIAGNOSIS — Z0000 Encounter for general adult medical examination without abnormal findings: Secondary | ICD-10-CM

## 2014-07-03 DIAGNOSIS — H6120 Impacted cerumen, unspecified ear: Secondary | ICD-10-CM

## 2014-07-03 DIAGNOSIS — E018 Other iodine-deficiency related thyroid disorders and allied conditions: Secondary | ICD-10-CM

## 2014-07-03 DIAGNOSIS — M19042 Primary osteoarthritis, left hand: Secondary | ICD-10-CM

## 2014-07-03 DIAGNOSIS — Z124 Encounter for screening for malignant neoplasm of cervix: Secondary | ICD-10-CM

## 2014-07-03 DIAGNOSIS — B9689 Other specified bacterial agents as the cause of diseases classified elsewhere: Secondary | ICD-10-CM

## 2014-07-03 DIAGNOSIS — M25579 Pain in unspecified ankle and joints of unspecified foot: Secondary | ICD-10-CM

## 2014-07-03 DIAGNOSIS — F4321 Adjustment disorder with depressed mood: Secondary | ICD-10-CM

## 2014-07-03 DIAGNOSIS — G4700 Insomnia, unspecified: Secondary | ICD-10-CM

## 2014-07-03 DIAGNOSIS — N760 Acute vaginitis: Secondary | ICD-10-CM

## 2014-07-03 DIAGNOSIS — M19049 Primary osteoarthritis, unspecified hand: Secondary | ICD-10-CM

## 2014-07-03 DIAGNOSIS — C50411 Malignant neoplasm of upper-outer quadrant of right female breast: Secondary | ICD-10-CM

## 2014-07-03 DIAGNOSIS — G5600 Carpal tunnel syndrome, unspecified upper limb: Secondary | ICD-10-CM

## 2014-07-03 DIAGNOSIS — E0500 Thyrotoxicosis with diffuse goiter without thyrotoxic crisis or storm: Secondary | ICD-10-CM

## 2014-07-03 DIAGNOSIS — G5603 Carpal tunnel syndrome, bilateral upper limbs: Secondary | ICD-10-CM

## 2014-07-03 DIAGNOSIS — M545 Low back pain, unspecified: Secondary | ICD-10-CM

## 2014-07-03 DIAGNOSIS — M25562 Pain in left knee: Secondary | ICD-10-CM

## 2014-07-03 DIAGNOSIS — N841 Polyp of cervix uteri: Secondary | ICD-10-CM

## 2014-07-03 DIAGNOSIS — M19041 Primary osteoarthritis, right hand: Secondary | ICD-10-CM

## 2014-07-03 DIAGNOSIS — Z139 Encounter for screening, unspecified: Secondary | ICD-10-CM

## 2014-07-03 DIAGNOSIS — C50419 Malignant neoplasm of upper-outer quadrant of unspecified female breast: Secondary | ICD-10-CM

## 2014-07-03 LAB — POCT WET PREP WITH KOH
Clue Cells Wet Prep HPF POC: 50
KOH Prep POC: NEGATIVE
Trichomonas, UA: NEGATIVE
Yeast Wet Prep HPF POC: NEGATIVE

## 2014-07-03 LAB — COMPREHENSIVE METABOLIC PANEL
ALBUMIN: 4 g/dL (ref 3.5–5.2)
ALT: 38 U/L — AB (ref 0–35)
AST: 32 U/L (ref 0–37)
Alkaline Phosphatase: 121 U/L — ABNORMAL HIGH (ref 39–117)
BILIRUBIN TOTAL: 0.3 mg/dL (ref 0.2–1.2)
BUN: 16 mg/dL (ref 6–23)
CO2: 24 mEq/L (ref 19–32)
Calcium: 9.5 mg/dL (ref 8.4–10.5)
Chloride: 107 mEq/L (ref 96–112)
Creat: 0.72 mg/dL (ref 0.50–1.10)
Glucose, Bld: 76 mg/dL (ref 70–99)
POTASSIUM: 4 meq/L (ref 3.5–5.3)
SODIUM: 141 meq/L (ref 135–145)
TOTAL PROTEIN: 6.5 g/dL (ref 6.0–8.3)

## 2014-07-03 LAB — C-REACTIVE PROTEIN: CRP: 1.7 mg/dL — AB (ref <0.60)

## 2014-07-03 MED ORDER — METRONIDAZOLE 500 MG PO TABS: 500.0000 mg | ORAL_TABLET | Freq: Two times a day (BID) | ORAL | Status: DC

## 2014-07-03 MED ORDER — TRAZODONE HCL 50 MG PO TABS: 25.0000 mg | ORAL_TABLET | Freq: Every evening | ORAL | Status: DC | PRN

## 2014-07-03 NOTE — Progress Notes (Addendum)
Subjective:  This chart was scribed for Lauren Cheadle, MD by [name], Medial Scribe. This patient was seen in room 27 and the patient's care was started at 2:59 PM.    Patient ID: Lauren Farley, female    DOB: 08/22/1946, 68 y.o.   MRN: [id number]  Chief Complaint  Patient presents with  . Annual Exam    HPI HPI Comments: Lauren Farley is a 67 y.o. female with PMHx of breast cancer who presents to the [facility] for her yearly exam: Medicare physical.  Mood: Patient reports depression which she attributes to her mastectomy. One year post surgery, she is considering reconstructive surgery, because she is coping with losing her breast. Patient reports anhedonia, increase appetite and weight gain. She lacks desire to perform any activities, including going to the gym. Patient reports improvement this week, shares several visits to the gym.  Fall Risk: No falls in the past year.  Doctors: Dr. Barkley Bruns; surgeon who performed her bilateral mastectomy in 2014. Dr. Chancy Milroy; oncologist for her breast cancer. Recommended to continue anti estrogen therapy for 5 years, through 2019, but she declined Atrovent treatment or reconstruction at that time. Patient no longer sees Dr. Chancy Milroy and has not seen an oncologist. Patient states that her left eye is 35 mm larger than her right and reports need to visit to an ophthalmologist . She was last seen by Dr. Gershon Crane years ago; she is fine with following up. Patient does not have a dentist nor a gynecologist.   Memory: Patient denies memory deficits.  Hearing: Patient reports clogging and decreased hearing in her left ear. Right ear is normal.  ADLs (Activities of Daily Living): She denies difficulties with her daily activities.   Living Will/Power of Attorney: She does not have a living will.   Health Maintenance: In May 2014 patient had Dexa scan which was normal at [facility]. Last Tetanus vaccination in 2010. Patient does not receive .  Patient refuses colonoscopy; she choices to perform her testing at home. Patient denies abnormal Pap smear. Patient refuses Shingles and Pneumonia vaccination.   Medications:Two months ago patient's Levothyroxine dosage increased from 112 to 125 mg.   Patient reports improvement of her hand pain with psychical therapy at [facility]. However, patient had to stop going because they would not accept her secondary insurance.   Patient is wearing her braces at night, but she reports right knee pain. She shares persistent swelling after arthroscopic surgery years ago; states that her knee never fully healed. She treats the knee regularly with ice, elevation and Aleve, but denies full relief. Patient reports bilateral foot surgery two years ago; bone spurs taken from dorsum of feet. She reports left foot pain at incision site described as shooting pain with walking.   Patient reports recent weight gain and increased appetite, despite increase in Levothyroxine. Attributed to her depression.   Patient is taking Vitamin D; she is not taking calcium supplement. Patient states that she is lactose intolerant and only consumes dairy products occasionally. She does however drinks goat's milk twice a week.  Patient reports having a cervical polyp that she wants evaluated. Patient reports history of polyp removal years ago by her gynecologist.     Patient Active Problem List   Diagnosis Date Noted  . 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 KNEE PAIN 02/26/2009  . ROTATOR CUFF SYNDROME 02/26/2009  . LOW BACK PAIN, CHRONIC 10/29/2008  .  HYPOTHYROIDISM, POST-RADIOACTIVE IODINE 10/16/1988  . GRAVE'S DISEASE, HX OF 10/16/1978   Past Medical History  Diagnosis Date  . Arthritis   . Back pain   . Hot flashes   . Cancer     Breast  . Thyroid disease     Graves disease s/p RAI treatment  . Hypothyroidism   . Shortness of breath       with exertion  . Headache(784.0)   . GERD (gastroesophageal reflux disease)     mygestic herbal  . Insomnia   . Constipation   . History of blood transfusion     vein stripping in 70's  . Osteoporosis     Past Surgical History  Procedure Laterality Date  . Bunionectomy Bilateral     spurs  . Abdominal hysterectomy      TAH/BSO  . Varicose vein surgery    . Knee arthroscopy      left  . Mastectomy modified radical Bilateral 12/24/2012    LYMPH NODE BIOPSY   . Simple mastectomy with axillary sentinel node biopsy Bilateral 12/24/2012    Procedure: BILATERAL MASTECTOMY;  Surgeon: Odis Hollingshead, MD;  Location: [place];  Service: General;  Laterality: Bilateral;  . Axillary sentinel node biopsy Right 12/24/2012    Procedure: RIGHT AXILLARY SENTINEL LYMPH NODE BIOPSY;  Surgeon: Odis Hollingshead, MD;  Location: [place];  Service: General;  Laterality: Right;  NUCLEAR MEDICINE INJECTION - RIGHT SIDE  . Breast surgery Bilateral   . Tubal ligation     No Known Allergies Prior to Admission medications   Medication Sig Start Date End Date Taking? Authorizing Provider  B Complex-C (B-COMPLEX WITH VITAMIN C) tablet Take 1 tablet by mouth daily.   Yes Historical Provider, MD  Cholecalciferol (VITAMIN D3) 2000 UNITS TABS Take 1 tablet by mouth daily.   Yes Historical Provider, MD  levothyroxine (SYNTHROID, LEVOTHROID) 125 MCG tablet Take 1 tablet (125 mcg total) by mouth daily before breakfast. 04/26/14  Yes Shawnee Knapp, MD  meloxicam (MOBIC) 7.5 MG tablet Take 1-2 tablets daily for joint pain 04/24/14  Yes Shawnee Knapp, MD  UNABLE TO FIND Rx: [id number] Post Surgical Bra (Quantity: 6) [id number]- Silicone Breast Prosthesis (Quantity: 2) Dx: 174.9; Bilateral mastectomy 11/10/13  Yes Jackolyn Confer, MD   History   Social History  . Marital Status: Single    Spouse Name: N/A    Number of Children: N/A  . Years of Education: N/A   Occupational History  . Not on file.   Social History Main Topics   . Smoking status: Former Smoker -- 8 years  . Smokeless tobacco: Never Used     Comment: quit in th '80's  . Alcohol Use: Yes     Comment: 1-2 per week  . Drug Use: No  . Sexual Activity: Not Currently   Other Topics Concern  . Not on file   Social History Narrative  . No narrative on file    Review of Systems  Constitutional: Negative for fever and chills.  HENT: Positive for ear pain.   Cardiovascular: Positive for leg swelling.  Endocrine: Positive for cold intolerance.  Musculoskeletal: Positive for arthralgias and myalgias.  All other systems reviewed and are negative.      Objective:   Physical Exam  Nursing note and vitals reviewed. Constitutional: She is oriented to person, place, and time. She appears well-developed and well-nourished. No distress.  HENT:  Head: Normocephalic and atraumatic.  Nose: No mucosal edema.  Mouth/Throat: Oropharynx  is clear and moist. No oropharyngeal exudate.  Bilateral cerumen impaction. Unable to visualize canal.   Eyes: EOM are normal.  Neck: Normal range of motion. Neck supple.  Cardiovascular: Normal rate, regular rhythm and normal heart sounds.   Pulmonary/Chest: Effort normal and breath sounds normal. No respiratory distress. She has no wheezes. She has no rales.  Breast: status post bilateral mastectomy with no breast tissue palpable. Severe tenderess of palpation over anterious rib cage. No axillary adenopathy.  Abdominal: Soft. Bowel sounds are normal. There is tenderness. There is no rebound and no guarding.  Pain in epigastric and LUQ no rebound or guarding.   Genitourinary:  Minimal amount of thick white vaginal discharge. 1cm penduncular polyp at 4 to 5 o'clock area. Base not able to be visualized. No tenderness or fullnes of uterus and adnexia on exam.   Musculoskeletal: Normal range of motion.  Lymphadenopathy:    She has no cervical adenopathy.  Neurological: She is alert and oriented to person, place, and time.   Skin: Skin is warm and dry.  Psychiatric: She has a normal mood and affect. Her behavior is normal.    Filed Vitals:   07/03/14 1358  BP: 112/70  Pulse: 100  Temp: 98.3 F (36.8 C)  TempSrc: Oral  Resp: 16  Height: 5' 6.5" (1.689 m)  Weight: 215 lb (97.523 kg)  SpO2: 98%   [facility] reading (PRIMARY) by  Dr. Brigitte Pulse. EKG. normal sinus rhythm, no ischemic changes.       Assessment & Plan:    Routine general medical examination at a health care facility - Plan: EKG 12-Lead, Patient advised to add Calcium, fish oil and Omega 3 supplements to her daily regimen.   HYPOTHYROIDISM, POST-RADIOACTIVE IODINE - Plan: Vit D  25 hydroxy (rtn osteoporosis monitoring), TSH - will have to decrease levothyroxine from 125 to 112 and recheck in 6-8 wks.  Cervical polyp - Plan: Pap IG and HPV (high risk) DNA detection, Ambulatory referral to Gynecology, POCT Wet Prep with KOH  Adjustment disorder with depressed mood  Insomnia - Plan: Comprehensive metabolic panel - do trial of trazodone  Knee pain, chronic, left - Plan: Vit D  25 hydroxy (rtn osteoporosis monitoring), Ambulatory referral to Physical Therapy  Screening for cervical cancer - Plan: Pap IG and HPV (high risk) DNA detection - if normal/negative - no further paps needed.  Cerumen impaction, bilateral - removed by lavage in office today  Pain in joint, ankle and foot, unspecified laterality - Plan: Vit D  25 hydroxy (rtn osteoporosis monitoring), Vitamin B12, C-reactive protein, Ambulatory referral to Physical Therapy, Ambulatory referral to Podiatry  Ophthalmic Graves disease - Plan: TSH, Ambulatory referral to Ophthalmology - Graves disease treated but pt has noticed some vision changes and would like her eyes rechecked.  Cancer of upper-outer quadrant of female breast, right - Plan: Pap IG and HPV (high risk) DNA detection - pt going to f/u with surgery to get her breast implants - she is having much more psychological sxs from this  than she expected to and has not decreased in the year since her surgery - painful and often does go out as her clothes don't fit right - can't even go to church. Will ask surgeon to refer her to a plastic surgeon  Bilateral low back pain, with sciatica presence unspecified - Plan: C-reactive protein, Ambulatory referral to Physical Therapy  Bilateral carpal tunnel syndrome - Plan: Vit D  25 hydroxy (rtn osteoporosis monitoring), Ambulatory referral to Physical Therapy -  needs to see PT that accepts her secondary insurance as [facility] didn't - re-entered referral with this request  Primary osteoarthritis of both hands - Plan: C-reactive protein, Ambulatory referral to Physical Therapy  Encounter for long-term (current) use of other medications  Special screening for malignant neoplasms, colon - Plan: IFOBT POC (occult bld, rslt in office) - refuses colonoscopy but consents to doing yearly HOC  Bacterial vaginosis - course of flagyl  Meds ordered this encounter  Medications  . traZODone (DESYREL) 50 MG tablet    Sig: Take 0.5-1 tablets (25-50 mg total) by mouth at bedtime as needed for sleep.    Dispense:  30 tablet    Refill:  3  . metroNIDAZOLE (FLAGYL) 500 MG tablet    Sig: Take 1 tablet (500 mg total) by mouth 2 (two) times daily.    Dispense:  14 tablet    Refill:  0    I personally performed the services described in this documentation, which was scribed in my presence. The recorded information has been reviewed and considered, and addended by me as needed.  Lauren Cheadle, MD MPH   Results for orders placed in visit on 07/03/14  VITAMIN D 25 HYDROXY      Result Value Ref Range   Vit D, 25-Hydroxy 51  30 - 89 ng/mL  TSH      Result Value Ref Range   TSH 0.017 (*) 0.350 - 4.500 uIU/mL  VITAMIN B12      Result Value Ref Range   Vitamin B-12 1208 (*) 211 - 911 pg/mL  COMPREHENSIVE METABOLIC PANEL      Result Value Ref Range   Sodium 141  135 - 145 mEq/L   Potassium 4.0  3.5  - 5.3 mEq/L   Chloride 107  96 - 112 mEq/L   CO2 24  19 - 32 mEq/L   Glucose, Bld 76  70 - 99 mg/dL   BUN 16  6 - 23 mg/dL   Creat 0.72  0.50 - 1.10 mg/dL   Total Bilirubin 0.3  0.2 - 1.2 mg/dL   Alkaline Phosphatase 121 (*) 39 - 117 U/L   AST 32  0 - 37 U/L   ALT 38 (*) 0 - 35 U/L   Total Protein 6.5  6.0 - 8.3 g/dL   Albumin 4.0  3.5 - 5.2 g/dL   Calcium 9.5  8.4 - 10.5 mg/dL  C-REACTIVE PROTEIN      Result Value Ref Range   CRP 1.7 (*) <0.60 mg/dL  POCT WET PREP WITH KOH      Result Value Ref Range   Trichomonas, UA Negative     Clue Cells Wet Prep HPF POC 50%     Epithelial Wet Prep HPF POC 5-10     Yeast Wet Prep HPF POC neg     Bacteria Wet Prep HPF POC 3+     RBC Wet Prep HPF POC 0-1     WBC Wet Prep HPF POC 2-4     KOH Prep POC Negative

## 2014-07-03 NOTE — Progress Notes (Signed)
   Subjective:    Patient ID: Lauren Farley, female    DOB: 1946/04/09, 68 y.o.   MRN: [id number]  HPI    Review of Systems  Constitutional: Negative.   HENT: Positive for ear pain.        LEFT EAR  Eyes: Negative.   Respiratory: Negative.   Cardiovascular: Positive for leg swelling.  Gastrointestinal: Negative.   Endocrine: Positive for cold intolerance and polyphagia.  Genitourinary: Negative.   Musculoskeletal: Positive for arthralgias and myalgias.  Skin: Negative.   Allergic/Immunologic: Negative.   Neurological: Negative.   Hematological: Negative.   Psychiatric/Behavioral: Negative.        Objective:   Physical Exam        Assessment & Plan:

## 2014-07-03 NOTE — Patient Instructions (Signed)
Make an appointment with your dentist.  My dentist is Dr. Malcolm Metro at [facility] - he is VERY good.    Keeping You Healthy  Get These Tests  Blood Pressure- Have your blood pressure checked by your healthcare provider at least once a year.  Normal blood pressure is 120/80.  Weight- Have your body mass index (BMI) calculated to screen for obesity.  BMI is a measure of body fat based on height and weight.  You can calculate your own BMI at [employer]  Cholesterol- Have your cholesterol checked every year.  Diabetes- Have your blood sugar checked every year if you have high blood pressure, high cholesterol, a family history of diabetes or if you are overweight.  Pap Smear- Have a pap smear every 1 to 3 years if you have been sexually active.  If you are older than 65 and recent pap smears have been normal you may not need additional pap smears.  In addition, if you have had a hysterectomy  For benign disease additional pap smears are not necessary.  Mammogram-Yearly mammograms are essential for early detection of breast cancer  Screening for Colon Cancer- Colonoscopy starting at age 54. Screening may begin sooner depending on your family history and other health conditions.  Follow up colonoscopy as directed by your Gastroenterologist.  Screening for Osteoporosis- Screening begins at age 41 with bone density scanning, sooner if you are at higher risk for developing Osteoporosis.  Get these medicines  Calcium with Vitamin D- Your body requires 1200-1500 mg of Calcium a day and [phone] IU of Vitamin D a day.  You can only absorb 500 mg of Calcium at a time therefore Calcium must be taken in 2 or 3 separate doses throughout the day.  Hormones- Hormone therapy has been associated with increased risk for certain cancers and heart disease.  Talk to your healthcare provider about if you need relief from menopausal symptoms.  Aspirin- Ask your healthcare provider about  taking Aspirin to prevent Heart Disease and Stroke.  Get these Immuniztions  Flu shot- Every fall  Pneumonia shot- Once after the age of 4; if you are younger ask your healthcare provider if you need a pneumonia shot.  Tetanus- Every ten years.  Zostavax- Once after the age of 68 to prevent shingles.  Take these steps  Don't smoke- Your healthcare provider can help you quit. For tips on how to quit, ask your healthcare provider or go to www.smokefree.gov or call 1-800 QUIT-NOW.  Be physically active- Exercise 5 days a week for a minimum of 30 minutes.  If you are not already physically active, start slow and gradually work up to 30 minutes of moderate physical activity.  Try walking, dancing, bike riding, swimming, etc.  Eat a healthy diet- Eat a variety of healthy foods such as fruits, vegetables, whole grains, low fat milk, low fat cheeses, yogurt, lean meats, chicken, fish, eggs, dried beans, tofu, etc.  For more information go to www.thenutritionsource.org  Dental visit- Brush and floss teeth twice daily; visit your dentist twice a year.  Eye exam- Visit your Optometrist or Ophthalmologist yearly.  Drink alcohol in moderation- Limit alcohol intake to one drink or less a day.  Never drink and drive.  Depression- Your emotional health is as important as your physical health.  If you're feeling down or losing interest in things you normally enjoy, please talk to your healthcare provider.  Seat Belts- can save your life; always wear one  Smoke/Carbon Monoxide detectors-  These detectors need to be installed on the appropriate level of your home.  Replace batteries at least once a year.  Violence- If anyone is threatening or hurting you, please tell your healthcare provider.  Living Will/ Health care power of attorney- Discuss with your healthcare provider and family.

## 2014-07-03 NOTE — Progress Notes (Signed)
After verbal consent colace drops were placed in bilateral ears. Waited 7 minutes then flushed both ears with Peroxide and Warm Water at 1:1 ratio large amount of cerumen removed from bilateral ears. Patient tolerated well with no complaints of discomfort, however when she was leaving the office she stated she noted some drops of blood from the left ear. I looked in the ear and did not note any blood, asked the patient to wait for Dr Brigitte Pulse to look in her ear, but she indicated she needed to leave, if she has any further bleeding, pain, or problems with the ear she will call immediately. Dr Brigitte Pulse was advised.

## 2014-07-04 LAB — TSH: TSH: 0.017 u[IU]/mL — ABNORMAL LOW (ref 0.350–4.500)

## 2014-07-04 LAB — VITAMIN D 25 HYDROXY (VIT D DEFICIENCY, FRACTURES): Vit D, 25-Hydroxy: 51 ng/mL (ref 30–89)

## 2014-07-04 LAB — VITAMIN B12: VITAMIN B 12: 1208 pg/mL — AB (ref 211–911)

## 2014-07-05 ENCOUNTER — Encounter: Payer: Self-pay | Admitting: Family Medicine

## 2014-07-05 MED ORDER — LEVOTHYROXINE SODIUM 112 MCG PO TABS: 112.0000 ug | ORAL_TABLET | Freq: Every day | ORAL | Status: DC

## 2014-07-05 NOTE — Addendum Note (Signed)
Addended by: [name] on: 07/05/2014 12:40 AM   Modules accepted: Orders

## 2014-07-07 LAB — PAP IG AND HPV HIGH-RISK: HPV DNA HIGH RISK: NOT DETECTED

## 2014-07-09 ENCOUNTER — Ambulatory Visit (INDEPENDENT_AMBULATORY_CARE_PROVIDER_SITE_OTHER): Payer: Medicare HMO

## 2014-07-09 ENCOUNTER — Encounter: Payer: Self-pay | Admitting: Podiatry

## 2014-07-09 ENCOUNTER — Ambulatory Visit (INDEPENDENT_AMBULATORY_CARE_PROVIDER_SITE_OTHER): Payer: Commercial Managed Care - HMO | Admitting: Podiatry

## 2014-07-09 VITALS — BP 120/71 | HR 76 | Resp 16

## 2014-07-09 DIAGNOSIS — M722 Plantar fascial fibromatosis: Secondary | ICD-10-CM

## 2014-07-09 DIAGNOSIS — M898X9 Other specified disorders of bone, unspecified site: Secondary | ICD-10-CM

## 2014-07-09 NOTE — Progress Notes (Signed)
   Subjective:    Patient ID: Lauren Farley, female    DOB: 1946-08-10, 68 y.o.   MRN: [id number]  HPI Comments: "I have this knot on top"  Patient c/o sharp, pin like sticks dorsal foot left for few months. She has a knot. She has had previous surgery on the area for the same problem on both feet and the left one has started to swell and get painful again. Tried ice and elevating and Aleve-no help. Can't stand for long periods in one spot.  Foot Pain Associated symptoms include arthralgias and myalgias.      Review of Systems  Cardiovascular: Positive for leg swelling.  Endocrine: Positive for cold intolerance.  Musculoskeletal: Positive for arthralgias and myalgias.  All other systems reviewed and are negative.      Objective:   Physical Exam        Assessment & Plan:

## 2014-07-10 ENCOUNTER — Encounter: Payer: Self-pay | Admitting: Family Medicine

## 2014-07-10 LAB — IFOBT (OCCULT BLOOD): IFOBT: NEGATIVE

## 2014-07-10 NOTE — Progress Notes (Signed)
Subjective:     Patient ID: Lauren Farley, female   DOB: 1945-10-22, 68 y.o.   MRN: [id number]  Foot Pain   patient points to the dorsum of the left foot stating that she's getting a lot of pain around a bone spur and it is causing shooting pains in her toes. She had it resected several years ago but it was not successful and is creating more problems. She has tried shoe gear modification padding soaks and anti-inflammatories without relief. Also is developing pain in the center of the arch left with inflammation and pain to palpation   Review of Systems  All other systems reviewed and are negative.      Objective:   Physical Exam  Nursing note and vitals reviewed. Constitutional: She is oriented to person, place, and time.  Cardiovascular: Intact distal pulses.   Musculoskeletal: Normal range of motion.  Neurological: She is oriented to person, place, and time.  Skin: Skin is warm.   neurovascular status intact with muscle strength adequate range of motion the subtalar midtarsal joint within normal limits. Patient's noted to have incisions on the dorsum of the mid tarsal joint both feet with the right one being flat in the left one showing a prominence that when pressed is very painful when pressed with shooting pains going into the digits. 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 Today I reviewed the condition and discussed treatment. She had try cortisone injection without relief and she wants to have the bone resected due to the size on the x-ray and at this time I do think it would be her best interest. I allowed her to read a consent form line byline explaining the procedure and what we would do and risk and the fact that  this is revisional surgery. She signed consent form and is scheduled for outpatient surgery next week and also I will inject the mid fascially band left at the same time as the bone resection. I did dispensed night splint with instructions on usage. All preoperative instructions were given and she is encouraged to call us with any questions prior to procedure

## 2014-07-11 ENCOUNTER — Telehealth: Payer: Self-pay | Admitting: Hematology and Oncology

## 2014-07-14 ENCOUNTER — Ambulatory Visit: Payer: Commercial Managed Care - HMO | Admitting: Occupational Therapy

## 2014-07-14 ENCOUNTER — Encounter: Payer: Self-pay | Admitting: Podiatry

## 2014-07-14 DIAGNOSIS — M898X9 Other specified disorders of bone, unspecified site: Secondary | ICD-10-CM

## 2014-07-14 DIAGNOSIS — M722 Plantar fascial fibromatosis: Secondary | ICD-10-CM

## 2014-07-16 ENCOUNTER — Ambulatory Visit: Payer: Commercial Managed Care - HMO | Admitting: Oncology

## 2014-07-16 ENCOUNTER — Ambulatory Visit (INDEPENDENT_AMBULATORY_CARE_PROVIDER_SITE_OTHER): Payer: Self-pay | Admitting: General Surgery

## 2014-07-16 ENCOUNTER — Other Ambulatory Visit: Payer: Medicaid Other

## 2014-07-17 ENCOUNTER — Ambulatory Visit: Payer: Commercial Managed Care - HMO

## 2014-07-20 ENCOUNTER — Ambulatory Visit (INDEPENDENT_AMBULATORY_CARE_PROVIDER_SITE_OTHER): Payer: Commercial Managed Care - HMO | Admitting: Podiatry

## 2014-07-20 ENCOUNTER — Encounter: Payer: Self-pay | Admitting: Podiatry

## 2014-07-20 ENCOUNTER — Ambulatory Visit (INDEPENDENT_AMBULATORY_CARE_PROVIDER_SITE_OTHER): Payer: Medicare HMO

## 2014-07-20 VITALS — BP 123/77 | HR 86 | Resp 15

## 2014-07-20 DIAGNOSIS — M898X9 Other specified disorders of bone, unspecified site: Secondary | ICD-10-CM

## 2014-07-20 DIAGNOSIS — M25775 Osteophyte, left foot: Secondary | ICD-10-CM

## 2014-07-20 MED ORDER — HYDROCODONE-ACETAMINOPHEN 10-325 MG PO TABS: 1.0000 | ORAL_TABLET | Freq: Three times a day (TID) | ORAL | Status: DC | PRN

## 2014-07-20 NOTE — Progress Notes (Signed)
   Subjective:    Patient ID: Lauren Farley, female    DOB: October 20, 1945, 68 y.o.   MRN: [id number]  HPI Comments: DOS 07/14/2014 left tarsal exostectomy and injection to left arch.     Review of Systems     Objective:   Physical Exam        Assessment & Plan:

## 2014-07-21 NOTE — Progress Notes (Signed)
Subjective:     Patient ID: Lauren Farley, female   DOB: [birth year], 68 y.o.   MRN: [id number]  HPI patient presents stating she's doing well with minimal discomfort or swelling   Review of Systems     Objective:   Physical Exam Neurovascular status intact with incision site dorsal left which is doing well with wound edges well coapted and plantar arch doing better from injection    Assessment:     Healing well from tarsal exostectomy left    Plan:     Reviewed x-ray and at this time applied sterile dressing to compress and advised on continued elevation surgical shoe usage and reappoint 4 weeks earlier if any issues should occur

## 2014-07-22 NOTE — Progress Notes (Signed)
Dr Paulla Dolly performed a left tarsal exostectomy on 07/14/14

## 2014-07-28 ENCOUNTER — Other Ambulatory Visit: Payer: Self-pay | Admitting: Radiology

## 2014-07-28 ENCOUNTER — Ambulatory Visit: Payer: Commercial Managed Care - HMO | Attending: Family Medicine | Admitting: Occupational Therapy

## 2014-07-28 DIAGNOSIS — G5601 Carpal tunnel syndrome, right upper limb: Secondary | ICD-10-CM | POA: Insufficient documentation

## 2014-07-28 DIAGNOSIS — G5602 Carpal tunnel syndrome, left upper limb: Secondary | ICD-10-CM | POA: Insufficient documentation

## 2014-07-28 DIAGNOSIS — Z5189 Encounter for other specified aftercare: Secondary | ICD-10-CM | POA: Diagnosis not present

## 2014-07-28 DIAGNOSIS — G5603 Carpal tunnel syndrome, bilateral upper limbs: Secondary | ICD-10-CM

## 2014-07-28 DIAGNOSIS — M25649 Stiffness of unspecified hand, not elsewhere classified: Secondary | ICD-10-CM | POA: Insufficient documentation

## 2014-07-28 DIAGNOSIS — M79643 Pain in unspecified hand: Secondary | ICD-10-CM | POA: Diagnosis not present

## 2014-07-31 ENCOUNTER — Ambulatory Visit: Payer: Commercial Managed Care - HMO

## 2014-07-31 DIAGNOSIS — Z5189 Encounter for other specified aftercare: Secondary | ICD-10-CM | POA: Diagnosis not present

## 2014-08-05 ENCOUNTER — Ambulatory Visit: Payer: Commercial Managed Care - HMO | Admitting: Occupational Therapy

## 2014-08-05 DIAGNOSIS — Z5189 Encounter for other specified aftercare: Secondary | ICD-10-CM | POA: Diagnosis not present

## 2014-08-10 ENCOUNTER — Ambulatory Visit: Payer: Commercial Managed Care - HMO | Admitting: *Deleted

## 2014-08-10 DIAGNOSIS — Z5189 Encounter for other specified aftercare: Secondary | ICD-10-CM | POA: Diagnosis not present

## 2014-08-11 ENCOUNTER — Ambulatory Visit: Payer: Commercial Managed Care - HMO

## 2014-08-11 DIAGNOSIS — Z5189 Encounter for other specified aftercare: Secondary | ICD-10-CM | POA: Diagnosis not present

## 2014-08-13 ENCOUNTER — Encounter: Payer: Commercial Managed Care - HMO | Admitting: Occupational Therapy

## 2014-08-14 ENCOUNTER — Ambulatory Visit: Payer: Commercial Managed Care - HMO

## 2014-08-14 DIAGNOSIS — Z5189 Encounter for other specified aftercare: Secondary | ICD-10-CM | POA: Diagnosis not present

## 2014-08-17 ENCOUNTER — Encounter: Payer: Self-pay | Admitting: Podiatry

## 2014-08-17 ENCOUNTER — Ambulatory Visit (INDEPENDENT_AMBULATORY_CARE_PROVIDER_SITE_OTHER): Payer: Commercial Managed Care - HMO | Admitting: Podiatry

## 2014-08-17 ENCOUNTER — Other Ambulatory Visit: Payer: Self-pay | Admitting: *Deleted

## 2014-08-17 ENCOUNTER — Ambulatory Visit (INDEPENDENT_AMBULATORY_CARE_PROVIDER_SITE_OTHER): Payer: Medicaid Other

## 2014-08-17 VITALS — BP 128/75 | HR 89 | Resp 16

## 2014-08-17 DIAGNOSIS — M898X9 Other specified disorders of bone, unspecified site: Secondary | ICD-10-CM

## 2014-08-17 DIAGNOSIS — C50419 Malignant neoplasm of upper-outer quadrant of unspecified female breast: Secondary | ICD-10-CM

## 2014-08-18 ENCOUNTER — Ambulatory Visit (HOSPITAL_BASED_OUTPATIENT_CLINIC_OR_DEPARTMENT_OTHER): Payer: Commercial Managed Care - HMO | Admitting: Hematology and Oncology

## 2014-08-18 ENCOUNTER — Other Ambulatory Visit (HOSPITAL_BASED_OUTPATIENT_CLINIC_OR_DEPARTMENT_OTHER): Payer: Commercial Managed Care - HMO

## 2014-08-18 VITALS — BP 130/79 | HR 79 | Temp 98.5°F | Resp 18 | Ht 66.5 in | Wt 224.2 lb

## 2014-08-18 DIAGNOSIS — C50411 Malignant neoplasm of upper-outer quadrant of right female breast: Secondary | ICD-10-CM

## 2014-08-18 DIAGNOSIS — C50419 Malignant neoplasm of upper-outer quadrant of unspecified female breast: Secondary | ICD-10-CM

## 2014-08-18 DIAGNOSIS — C773 Secondary and unspecified malignant neoplasm of axilla and upper limb lymph nodes: Secondary | ICD-10-CM

## 2014-08-18 LAB — CBC WITH DIFFERENTIAL/PLATELET
BASO%: 0.3 % (ref 0.0–2.0)
Basophils Absolute: 0 10*3/uL (ref 0.0–0.1)
EOS%: 1.5 % (ref 0.0–7.0)
Eosinophils Absolute: 0.1 10*3/uL (ref 0.0–0.5)
HCT: 41.4 % (ref 34.8–46.6)
HGB: 14.1 g/dL (ref 11.6–15.9)
LYMPH#: 2 10*3/uL (ref 0.9–3.3)
LYMPH%: 33.8 % (ref 14.0–49.7)
MCH: 29.7 pg (ref 25.1–34.0)
MCHC: 34.1 g/dL (ref 31.5–36.0)
MCV: 87.2 fL (ref 79.5–101.0)
MONO#: 0.6 10*3/uL (ref 0.1–0.9)
MONO%: 10 % (ref 0.0–14.0)
NEUT%: 54.4 % (ref 38.4–76.8)
NEUTROS ABS: 3.3 10*3/uL (ref 1.5–6.5)
Platelets: 190 10*3/uL (ref 145–400)
RBC: 4.75 10*6/uL (ref 3.70–5.45)
RDW: 14.3 % (ref 11.2–14.5)
WBC: 6 10*3/uL (ref 3.9–10.3)

## 2014-08-18 LAB — COMPREHENSIVE METABOLIC PANEL (CC13)
ALK PHOS: 123 U/L (ref 40–150)
ALT: 24 U/L (ref 0–55)
AST: 19 U/L (ref 5–34)
Albumin: 3.7 g/dL (ref 3.5–5.0)
Anion Gap: 10 mEq/L (ref 3–11)
BUN: 14.4 mg/dL (ref 7.0–26.0)
CO2: 22 mEq/L (ref 22–29)
Calcium: 9.3 mg/dL (ref 8.4–10.4)
Chloride: 109 mEq/L (ref 98–109)
Creatinine: 0.9 mg/dL (ref 0.6–1.1)
Glucose: 110 mg/dl (ref 70–140)
POTASSIUM: 3.8 meq/L (ref 3.5–5.1)
SODIUM: 141 meq/L (ref 136–145)
TOTAL PROTEIN: 7.1 g/dL (ref 6.4–8.3)
Total Bilirubin: 0.35 mg/dL (ref 0.20–1.20)

## 2014-08-18 NOTE — Progress Notes (Signed)
Subjective:     Patient ID: Lauren Farley, female   DOB: November 24, 1945, 68 y.o.   MRN: [id number]  HPIpatient states my left foot is feeling good with minimal discomfort   Review of Systems     Objective:   Physical Exam Neurovascular status intact with well-healing surgical site dorsum left foot with mild inflammation noted    Assessment:     Improving foot left with removal of bone spur    Plan:     Advised on continued wider-type shoes and gradual return to normal activity. Reappoint to recheck

## 2014-08-18 NOTE — Progress Notes (Signed)
Patient Care Team: Shawnee Knapp, MD as PCP - General (Family Medicine)  DIAGNOSIS: Cancer of upper-outer quadrant of female breast s/p bilateral mastectomies and right SLNBx on 12/24/12   Staging form: Breast, AJCC 7th Edition     Clinical: Stage 0 (Tis (DCIS), N0, cM0) - Unsigned       Staging comments: Staged at breast conference 2.19.14      Pathologic: No stage assigned - Unsigned   PRIOR THERAPY: #1patient noted a mass in the right breast underneath the nipple at the 9:00 position. Mammogram demonstrated the lesion. 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 The tumor was ER positive PR positive with a proliferation marker Ki-67 6%.  #2 bilateral mastectomies and right axillary sentinel lymph node biopsy on 12/24/2012. 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 Tumor was ER positive PR positive with a proliferation marker Ki-67 of 6%  3. Patient refuses antiestrogen therapy and is currently on annual physical exams surveillance CHIEF COMPLIANT: recent knee and left foot surgeries  INTERVAL HISTORY: Lauren Farley is [place]) with above-mentioned history of DCIS with suspicion of a small amount of invasive ductal carcinoma. She underwent bilateral mastectomies and diffuse antiestrogen therapy. She is doing quite well from the standpoint. She's had problems with her legs including his sore on the foot from a bone spur that was removed. Still healing from that. She also has problems with her left knee. She is having a tough timegetting up the stairs.other than that she is without any complaints or concerns today. Patient plans to do breast reconstruction in March 2016.  REVIEW OF SYSTEMS:   Constitutional: Denies fevers, chills or abnormal weight loss Eyes: Denies blurriness of vision Ears, nose, mouth, throat, and face: Denies  mucositis or sore throat Respiratory: Denies cough, dyspnea or wheezes Cardiovascular: Denies palpitation, chest discomfort or lower extremity swelling Gastrointestinal:  Denies nausea, heartburn or change in bowel habits Skin: Denies abnormal skin rashes Lymphatics: Denies new lymphadenopathy or easy bruising Neurological:Denies numbness, tingling or new weaknesses Behavioral/Psych: Mood is stable, no new changes  Breast:  denies any pain or lumps or nodules in chest wall All other systems were reviewed with the patient and are negative.  I have reviewed the past medical history, past surgical history, social history and family history with the patient and they are unchanged from previous note.  ALLERGIES:  has No Known Allergies.  MEDICATIONS:  Current Outpatient Prescriptions  Medication Sig Dispense Refill  . B Complex-C (B-COMPLEX WITH VITAMIN C) tablet Take 1 tablet by mouth daily.    . Cholecalciferol (VITAMIN D3) 2000 UNITS TABS Take 1 tablet by mouth daily.    Marland Kitchen levothyroxine (SYNTHROID, LEVOTHROID) 112 MCG tablet Take 1 tablet (112 mcg total) by mouth daily before breakfast. 30 tablet 2   No current facility-administered medications for this visit.    PHYSICAL EXAMINATION: ECOG PERFORMANCE STATUS: 1 - Symptomatic but completely ambulatory  Filed Vitals:   08/18/14 0935  BP: 130/79  Pulse: 79  Temp: 98.5 F (36.9 C)  Resp: 18   Filed Weights   08/18/14 0935  Weight: 224 lb 3.2 oz (101.696 kg)    GENERAL:alert, no distress and comfortable SKIN: skin color, texture, turgor are normal, no rashes or significant lesions EYES: normal, Conjunctiva are pink and non-injected, sclera clear OROPHARYNX:no exudate, no erythema and lips, buccal mucosa, and tongue  normal  NECK: supple, thyroid normal size, non-tender, without nodularity LYMPH:  no palpable lymphadenopathy in the cervical, axillary or inguinal LUNGS: clear to auscultation and percussion with normal breathing  effort HEART: regular rate & rhythm and no murmurs and no lower extremity edema ABDOMEN:abdomen soft, non-tender and normal bowel sounds Musculoskeletal:no cyanosis of digits and no clubbing  NEURO: alert & oriented x 3 with fluent speech, no focal motor/sensory deficits BREAST: No palpable masses or nodules and chest wall or axilla. No palpable axillary supraclavicular or infraclavicular adenopathy no breast tenderness or nipple discharge.   LABORATORY DATA:  I have reviewed the data as listed   Chemistry      Component Value Date/Time   NA 141 08/18/2014 0848   NA 141 07/03/2014 1555   K 3.8 08/18/2014 0848   K 4.0 07/03/2014 1555   CL 107 07/03/2014 1555   CL 106 12/04/2012 0826   CO2 22 08/18/2014 0848   CO2 24 07/03/2014 1555   BUN 14.4 08/18/2014 0848   BUN 16 07/03/2014 1555   CREATININE 0.9 08/18/2014 0848   CREATININE 0.72 07/03/2014 1555   CREATININE 0.76 02/26/2009 2217      Component Value Date/Time   CALCIUM 9.3 08/18/2014 0848   CALCIUM 9.5 07/03/2014 1555   ALKPHOS 123 08/18/2014 0848   ALKPHOS 121* 07/03/2014 1555   AST 19 08/18/2014 0848   AST 32 07/03/2014 1555   ALT 24 08/18/2014 0848   ALT 38* 07/03/2014 1555   BILITOT 0.35 08/18/2014 0848   BILITOT 0.3 07/03/2014 1555       Lab Results  Component Value Date   WBC 6.0 08/18/2014   HGB 14.1 08/18/2014   HCT 41.4 08/18/2014   MCV 87.2 08/18/2014   PLT 190 08/18/2014   NEUTROABS 3.3 08/18/2014    ASSESSMENT & PLAN:  Cancer of upper-outer quadrant of female breast s/p bilateral mastectomies and right SLNBx on 12/24/12 stage I (T1 N0) invasive ductal carcinoma of the right breast status post bilateral mastectomies with a right sentinel lymph node biopsy. Patient refuses antiestrogen therapy. She is here for one-year followup. Reports no problems with her breast or chest wall area. She is undergoing reconstruction next year March 2016.  Knee pain arthritis and weight gain issues: Patient plans to  workout in the gym to lose weight. Survivorship: Encouraged to stay active and exercise. And eat less red meat.   No orders of the defined types were placed in this encounter.   The patient has a good understanding of the overall plan. she agrees with it. She will call with any problems that may develop before her next visit here.  I spent 15 minutes counseling the patient face to face. The total time spent in the appointment was 15 minutes and more than 50% was on counseling and review of test results    Rulon Eisenmenger, MD 08/18/2014 10:28 AM

## 2014-08-18 NOTE — Assessment & Plan Note (Signed)
stage I (T1 N0) invasive ductal carcinoma of the right breast status post bilateral mastectomies with a right sentinel lymph node biopsy. Patient refuses antiestrogen therapy. She is here for one-year followup. Reports no problems with her breast or chest wall area. She is undergoing reconstruction next year March 2016.  Knee pain arthritis and weight gain issues: Patient plans to workout in the gym to lose weight. Survivorship: Encouraged to stay active and exercise. And eat less red meat.

## 2014-08-19 ENCOUNTER — Ambulatory Visit: Payer: Commercial Managed Care - HMO | Attending: Family Medicine

## 2014-08-19 DIAGNOSIS — Z5189 Encounter for other specified aftercare: Secondary | ICD-10-CM | POA: Diagnosis not present

## 2014-08-19 DIAGNOSIS — G5602 Carpal tunnel syndrome, left upper limb: Secondary | ICD-10-CM | POA: Diagnosis not present

## 2014-08-19 DIAGNOSIS — M79643 Pain in unspecified hand: Secondary | ICD-10-CM | POA: Insufficient documentation

## 2014-08-19 DIAGNOSIS — M25562 Pain in left knee: Secondary | ICD-10-CM

## 2014-08-19 DIAGNOSIS — M545 Low back pain, unspecified: Secondary | ICD-10-CM

## 2014-08-19 DIAGNOSIS — G5601 Carpal tunnel syndrome, right upper limb: Secondary | ICD-10-CM | POA: Insufficient documentation

## 2014-08-19 DIAGNOSIS — M25649 Stiffness of unspecified hand, not elsewhere classified: Secondary | ICD-10-CM | POA: Insufficient documentation

## 2014-08-19 NOTE — Therapy (Signed)
Physical Therapy Treatment  Patient Details  Name: Lauren Farley MRN: [id number] Date of Birth: May 06, 1946  Encounter Date: 08/19/2014      PT End of Session - 08/19/14 1004    Visit Number 4   Number of Visits 8   PT Start Time 0910   PT Stop Time 0955   PT Time Calculation (min) 45 min   Activity Tolerance Patient tolerated treatment well  declined heat /cold      Past Medical History  Diagnosis Date  . Arthritis   . Back pain   . Hot flashes   . Cancer     Breast  . Thyroid disease     Graves disease s/p RAI treatment  . Hypothyroidism   . Shortness of breath     with exertion  . Headache(784.0)   . GERD (gastroesophageal reflux disease)     mygestic herbal  . Insomnia   . Constipation   . History of blood transfusion     vein stripping in 70's  . Osteoporosis     Past Surgical History  Procedure Laterality Date  . Bunionectomy Bilateral     spurs  . Abdominal hysterectomy      TAH/BSO  . Varicose vein surgery    . Knee arthroscopy      left  . Mastectomy modified radical Bilateral 12/24/2012    LYMPH NODE BIOPSY   . Simple mastectomy with axillary sentinel node biopsy Bilateral 12/24/2012    Procedure: BILATERAL MASTECTOMY;  Surgeon: Odis Hollingshead, MD;  Location: [facility];  Service: General;  Laterality: Bilateral;  . Axillary sentinel node biopsy Right 12/24/2012    Procedure: RIGHT AXILLARY SENTINEL LYMPH NODE BIOPSY;  Surgeon: Odis Hollingshead, MD;  Location: [facility];  Service: General;  Laterality: Right;  NUCLEAR MEDICINE INJECTION - RIGHT SIDE  . Breast surgery Bilateral   . Tubal ligation      There were no vitals taken for this visit.  Visit Diagnosis:  Bilateral low back pain without sciatica  Left knee pain          Adult PT Treatment/Exercise - 08/19/14 0700    Exercises   Exercises --  Nustep UE/LE 6 minutes L5   Knee/Hip Exercises: Stretches   Piriformis Stretch 2 reps;20 seconds  RT and LT   Knee/Hip Exercises: Seated   Long Arc Quad Left;3 sets;10 reps;Right   Knee/Hip Exercises: Supine   Bridges Both;15 reps   Other Supine Knee Exercises --  Knee to chest x3 15 secnds RT and LT   Other Supine Knee Exercises --  posterior pekvic tilt x10 5 sec   Knee/Hip Exercises: Sidelying   Hip ABduction Left;Both;1 set;15 reps   Knee/Hip Exercises: Prone   Hamstring Curl 2 sets;10 reps  RT and LT leg   Hip Extension Right;Left;2 sets;10 reps   Hip Extension Limitations --  decreased hip extension due to stiffness          Education - 08/19/14 0949    Education provided Yes   Education Details discussed need to strength train and stretch for life   Education Details Patient   Methods Explanation   Comprehension Verbalized understanding    Also reviewed some machine exercise with patient and asked her to ask the fitness center employees to assist with set up for max safety whenworking out at gym      PT Short Term Goals - 08/19/14 1000    PT SHORT TERM GOAL #1   Title independent with  inital HEP   Period Weeks   Status Achieved   PT SHORT TERM GOAL #2   Title Lt knee pain decr 25% with walking   Status Achieved   PT SHORT TERM GOAL #3   Title sit with 25% less pain   Status Achieved   PT SHORT TERM GOAL #4   Title LBP decreased 25%   Time 2   Period Weeks   Status On-going          PT Long Term Goals - 08/19/14 1002    PT LONG TERM GOAL #1   Title Demonstrate understanding of good posture   Time 2   Period Weeks   Status On-going   PT LONG TERM GOAL #2   Title Independent with advanced HEP   Time 2   Period Weeks   Status On-going   PT LONG TERM GOAL #3   Title Report pain in LT knee improved 50% with walking   Time 2   Period Weeks   PT LONG TERM GOAL #4   Status On-going   PT LONG TERM GOAL #5   Title Report back pain decreased 50% with bending and performing home tasks   Time 2   Period Weeks   Status On-going          Plan - 08/19/14 1005    Clinical Impression  Statement Pt still need progression of HEP for core strength LE strength    STG # 1-3 met   Pt will benefit from skilled therapeutic intervention in order to improve on the following deficits Pain;Decreased range of motion   Rehab Potential Good   PT Frequency Min 2X/week   PT Duration --  2 weeks   PT Treatment/Interventions Therapeutic exercise;Modalities;Patient/family education   PT Plan Continue and advance HEP for strength LE and core         Problem List Patient Active Problem List   Diagnosis Date Noted  . 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 KNEE PAIN 02/26/2009  . ROTATOR CUFF SYNDROME 02/26/2009  . LOW BACK PAIN, CHRONIC 10/29/2008  . HYPOTHYROIDISM, POST-RADIOACTIVE IODINE 10/16/1988  . GRAVE'S DISEASE, HX OF 10/16/1978                                            Darrel Hoover 08/19/2014, 10:08 AM

## 2014-08-20 ENCOUNTER — Ambulatory Visit: Payer: Commercial Managed Care - HMO | Admitting: Occupational Therapy

## 2014-08-20 ENCOUNTER — Encounter: Payer: Self-pay | Admitting: Occupational Therapy

## 2014-08-20 DIAGNOSIS — Z5189 Encounter for other specified aftercare: Secondary | ICD-10-CM | POA: Diagnosis not present

## 2014-08-20 DIAGNOSIS — M25649 Stiffness of unspecified hand, not elsewhere classified: Secondary | ICD-10-CM

## 2014-08-20 DIAGNOSIS — M6281 Muscle weakness (generalized): Secondary | ICD-10-CM

## 2014-08-20 DIAGNOSIS — M79643 Pain in unspecified hand: Secondary | ICD-10-CM

## 2014-08-20 NOTE — Therapy (Signed)
Occupational Therapy Treatment  Patient Details  Name: Lauren Farley MRN: [id number] Date of Birth: September 13, 1946  Encounter Date: 08/20/2014      OT End of Session - 08/20/14 1426    Visit Number 4  4/10 G   Number of Visits 16   Date for OT Re-Evaluation 09/25/14  STG'S due 08/27/14   OT Start Time 1320   OT Stop Time 1405   OT Time Calculation (min) 45 min   Activity Tolerance Patient tolerated treatment well      Past Medical History  Diagnosis Date  . Arthritis   . Back pain   . Hot flashes   . Cancer     Breast  . Thyroid disease     Graves disease s/p RAI treatment  . Hypothyroidism   . Shortness of breath     with exertion  . Headache(784.0)   . GERD (gastroesophageal reflux disease)     mygestic herbal  . Insomnia   . Constipation   . History of blood transfusion     vein stripping in 70's  . Osteoporosis     Past Surgical History  Procedure Laterality Date  . Bunionectomy Bilateral     spurs  . Abdominal hysterectomy      TAH/BSO  . Varicose vein surgery    . Knee arthroscopy      left  . Mastectomy modified radical Bilateral 12/24/2012    LYMPH NODE BIOPSY   . Simple mastectomy with axillary sentinel node biopsy Bilateral 12/24/2012    Procedure: BILATERAL MASTECTOMY;  Surgeon: Odis Hollingshead, MD;  Location: [facility];  Service: General;  Laterality: Bilateral;  . Axillary sentinel node biopsy Right 12/24/2012    Procedure: RIGHT AXILLARY SENTINEL LYMPH NODE BIOPSY;  Surgeon: Odis Hollingshead, MD;  Location: [place];  Service: General;  Laterality: Right;  NUCLEAR MEDICINE INJECTION - RIGHT SIDE  . Breast surgery Bilateral   . Tubal ligation      There were no vitals taken for this visit.  Visit Diagnosis:  Pain in joint, hand, unspecified laterality  Stiffness of hand joint, unspecified laterality  Generalized muscle weakness          OT Treatments/Exercises (OP) - 08/20/14 1420    ADLs   ADL Comments Discussed alternative ways to  hold pots/pans, wring out washclothes, A/E recommendations   ADL Education Given Yes   General Comments Discussed joint protection techniques, and adaptations to tasks to place pressure/weight on larger joints vs. smaller wrist and finger joints   Exercises   Exercises Hand  Reviewed median nerve gliding ex's (previously issued)          Education - 08/20/14 1418    Education provided Yes   Education Details Theraputty HEP (Yellow)   Education Details Patient   Methods Explanation;Demonstration;Handout   Comprehension Verbalized understanding;Returned demonstration  Pt also instructed in compensatory strategies for ADLS, Joint protection techniques for ADLS          OT Short Term Goals - 08/20/14 1431    OT SHORT TERM GOAL #1   Title Pt will be independent w/ initial HEP   Time 1   Period Weeks   Status On-going   OT SHORT TERM GOAL #2   Title Pt will report pain in Rt hand less than or equal to 3/10 for ADLS   Time 1   Period Weeks   Status On-going   OT SHORT TERM GOAL #3   Title Pt will report pain less  than or equal to 6/10 in Lt hand for light ADLS   Time 1   Period Weeks   Status On-going   OT SHORT TERM GOAL #4   Title Pt will demo at least 95% gross finger flexion in Lt hand to assist with gripping tasks   Time 1   Period Weeks   Status On-going   OT SHORT TERM GOAL #5   Title Pt will verbalize understanding of updated splint wear and care prn   Time 1   Period Weeks   Status On-going          OT Long Term Goals - 08/20/14 1435    OT LONG TERM GOAL #1   Title Pt will verbalize understanding of activity modifications for ADLS/IADLS to decrease pain/increase eas prn   Time 5   Period Weeks   Status On-going   OT LONG TERM GOAL #2   Title Pt will be independent with updated HEP   Time 5   Period Weeks   Status On-going   OT LONG TERM GOAL #3   Title Pt will report pain less than or equal to 4/10 in Lt hand for light ADLS   Time 5   Status  On-going   OT LONG TERM GOAL #4   Title Pt will improve grip strength by at least 10 lbs bilaterally   Time 5   Period Weeks   Status On-going   OT LONG TERM GOAL #5   Title Pt will improve Lt lateral pinch strength by at least 2 lbs   Time 5   Period Weeks   Status On-going   Long Term Additional Goals   Additional Long Term Goals Yes   OT LONG TERM GOAL #6   Title Pt will improve LUE functional use as shown by improving Quick Dash Disability Score to 35% or less   Time 5   Period Weeks   Status On-going          Plan - 08/20/14 1429    Clinical Impression Statement Pt responding to education and recommendations well. Pt aware that some pain may not full resolve.   OT Plan Modalities prn, fabricate daytime trigger finger splint, BEGIN CHECKING STG's since pt only coming 1x/wk         Problem List Patient Active Problem List   Diagnosis Date Noted  . 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 KNEE PAIN 02/26/2009  . ROTATOR CUFF SYNDROME 02/26/2009  . LOW BACK PAIN, CHRONIC 10/29/2008  . HYPOTHYROIDISM, POST-RADIOACTIVE IODINE 10/16/1988  . GRAVE'S DISEASE, HX OF 10/16/1978                                            Carey Bullocks 08/20/2014, 2:44 PM

## 2014-08-20 NOTE — Patient Instructions (Signed)
Flexion (Resistive Putty)   1. Keeping fingertips straight, press putty towards base of palm. Repeat _10___ times. Do _2___ sessions per day.  2. Roll putty into thick tube on table, Pinch between each finger and thumb Copyright  VHI. All rights reserved.

## 2014-08-21 ENCOUNTER — Ambulatory Visit: Payer: Commercial Managed Care - HMO

## 2014-08-24 ENCOUNTER — Ambulatory Visit: Payer: Commercial Managed Care - HMO | Admitting: Occupational Therapy

## 2014-08-26 ENCOUNTER — Encounter: Payer: Self-pay | Admitting: Occupational Therapy

## 2014-08-26 ENCOUNTER — Ambulatory Visit: Payer: Commercial Managed Care - HMO | Admitting: Occupational Therapy

## 2014-08-26 DIAGNOSIS — M25649 Stiffness of unspecified hand, not elsewhere classified: Secondary | ICD-10-CM

## 2014-08-26 DIAGNOSIS — Z5189 Encounter for other specified aftercare: Secondary | ICD-10-CM | POA: Diagnosis not present

## 2014-08-26 DIAGNOSIS — M6281 Muscle weakness (generalized): Secondary | ICD-10-CM

## 2014-08-26 DIAGNOSIS — M79643 Pain in unspecified hand: Secondary | ICD-10-CM

## 2014-08-26 NOTE — Therapy (Signed)
Occupational Therapy Treatment  Patient Details  Name: Lauren Farley MRN: [id number] Date of Birth: [birth year]  Encounter Date: 08/26/2014      OT End of Session - 08/26/14 1433    Visit Number 5  5/10 G   Number of Visits 16   Date for OT Re-Evaluation 09/25/14   Authorization Type [employer] MCR (1), MCD (2)   OT Start Time 1315   OT Stop Time 1400   OT Time Calculation (min) 45 min   Activity Tolerance Patient tolerated treatment well      Past Medical History  Diagnosis Date  . Arthritis   . Back pain   . Hot flashes   . Cancer     Breast  . Thyroid disease     Graves disease s/p RAI treatment  . Hypothyroidism   . Shortness of breath     with exertion  . Headache(784.0)   . GERD (gastroesophageal reflux disease)     mygestic herbal  . Insomnia   . Constipation   . History of blood transfusion     vein stripping in 70's  . Osteoporosis     Past Surgical History  Procedure Laterality Date  . Bunionectomy Bilateral     spurs  . Abdominal hysterectomy      TAH/BSO  . Varicose vein surgery    . Knee arthroscopy      left  . Mastectomy modified radical Bilateral 12/24/2012    LYMPH NODE BIOPSY   . Simple mastectomy with axillary sentinel node biopsy Bilateral 12/24/2012    Procedure: BILATERAL MASTECTOMY;  Surgeon: Odis Hollingshead, MD;  Location: [facility];  Service: General;  Laterality: Bilateral;  . Axillary sentinel node biopsy Right 12/24/2012    Procedure: RIGHT AXILLARY SENTINEL LYMPH NODE BIOPSY;  Surgeon: Odis Hollingshead, MD;  Location: [facility];  Service: General;  Laterality: Right;  NUCLEAR MEDICINE INJECTION - RIGHT SIDE  . Breast surgery Bilateral   . Tubal ligation      There were no vitals taken for this visit.  Visit Diagnosis:  Pain in joint, hand, unspecified laterality  Stiffness of hand joint, unspecified laterality  Generalized muscle weakness      Subjective Assessment - 08/26/14 1319    Symptoms "My Rt hand doesn't hurt as bad  as the Lt hand"   Pertinent History Pt diagnosed w/ bilateral CTS, however, pt is presenting more like bilateral long finger triggering   Currently in Pain? Yes   Pain Score 4    Pain Location Finger (Comment which one)  lt long finger   Pain Orientation Left   Pain Type Chronic pain   Pain Onset More than a month ago   Aggravating Factors  carrying and lifting objects   Effect of Pain on Daily Activities CTS gloves, ice            OT Treatments/Exercises (OP) - 08/26/14 1429    ADLs   General Comments Discussed reviewed STG's and progress to date. Pt overall with decreased pain during day, but pt's pain increases up to 10/10 in the early am   Splinting   Splinting Fabricated and fitted Lt hand daytime splint to block long finger MP joint in extension to prevent triggering.      Education provided: splint wear and care. Pt verbalized understanding and handout provided       OT Short Term Goals - 08/26/14 1435    OT SHORT TERM GOAL #1   Title Pt will be independent  w/ initial HEP   Status Achieved   OT SHORT TERM GOAL #2   Title Pt will report pain in Rt hand less than or equal to 3/10 for ADLS   Status Achieved   OT SHORT TERM GOAL #3   Title Pt will report pain less than or equal to 6/10 in Lt hand for light ADLS   Status Not Met  up to 10/10 in the am. 6/10 during day   OT SHORT TERM GOAL #4   Title Pt will demo at least 95% gross finger flexion in Lt hand to assist with gripping tasks   Status Not Met  Pt approx. 80% gross finger flexion   OT SHORT TERM GOAL #5   Title Pt will verbalize understanding of updated splint wear and care prn   Status Achieved          OT Long Term Goals - 08/26/14 1437    OT LONG TERM GOAL #1   Title Pt will verbalize understanding of activity modifications for ADLS/IADLS to decrease pain/increase eas prn   Baseline due 09/25/14 but pt wishes to d/c in Nov.   Status On-going   OT LONG TERM GOAL #2   Title Pt will be independent  with updated HEP   Status On-going   OT LONG TERM GOAL #3   Title Pt will report pain less than or equal to 4/10 in Lt hand for light ADLS   Status On-going   OT LONG TERM GOAL #4   Title Pt will improve grip strength by at least 10 lbs bilaterally   Status On-going   OT LONG TERM GOAL #5   Title Pt will improve Lt lateral pinch strength by at least 2 lbs   Status On-going   OT LONG TERM GOAL #6   Title Pt will improve LUE functional use as shown by improving Quick Dash Disability Score to 35% or less   Status On-going          Plan - 08/26/14 1438    Clinical Impression Statement Pt with diagnosis of bilateral CTS, however pt reports triggering bilateral long fingers (Lt worse than Rt). Pt also w/ bilateral hand arthritis limiting ROM especially in the early am   Plan Assess daytime "trigger finger" splint, continue towards unmet STG's and all LTG's        Problem List Patient Active Problem List   Diagnosis Date Noted  . 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 KNEE PAIN 02/26/2009  . ROTATOR CUFF SYNDROME 02/26/2009  . LOW BACK PAIN, CHRONIC 10/29/2008  . HYPOTHYROIDISM, POST-RADIOACTIVE IODINE 10/16/1988  . GRAVE'S DISEASE, HX OF 10/16/1978                                              Carey Bullocks 08/26/2014, 2:53 PM

## 2014-08-26 NOTE — Patient Instructions (Addendum)
Splint Instructions Splint Wearing Schedule: Apply splint to left hand. Splint should be worn during aggravating or lifting/gripping activities during the day.  Can wear other wrist braces or CTS gloves for night time  Clean splint w/ rubbing alcohol once per day. Keep away from heat sources.  CAUTION: Check skin for pressure marks after splint is removed. If splint causes problems, please remove and bring with you to next appt. For adjustments  Copyright  VHI. All rights reserved.

## 2014-09-01 ENCOUNTER — Encounter: Payer: Commercial Managed Care - HMO | Admitting: Occupational Therapy

## 2014-09-02 ENCOUNTER — Ambulatory Visit: Payer: Commercial Managed Care - HMO

## 2014-09-03 ENCOUNTER — Ambulatory Visit: Payer: Commercial Managed Care - HMO | Admitting: Occupational Therapy

## 2014-09-03 ENCOUNTER — Encounter: Payer: Self-pay | Admitting: Occupational Therapy

## 2014-09-03 DIAGNOSIS — M6281 Muscle weakness (generalized): Secondary | ICD-10-CM

## 2014-09-03 DIAGNOSIS — M79643 Pain in unspecified hand: Secondary | ICD-10-CM

## 2014-09-03 DIAGNOSIS — Z5189 Encounter for other specified aftercare: Secondary | ICD-10-CM | POA: Diagnosis not present

## 2014-09-03 DIAGNOSIS — M25649 Stiffness of unspecified hand, not elsewhere classified: Secondary | ICD-10-CM

## 2014-09-03 NOTE — Therapy (Signed)
Occupational Therapy Treatment  Patient Details  Name: Lauren Farley MRN: [id number] Date of Birth: [birth year]  Encounter Date: 09/03/2014      OT End of Session - 09/03/14 1524    Visit Number 6   Number of Visits 16   Date for OT Re-Evaluation 09/25/14   Authorization Type [employer] MCR (1), MCD (2)   OT Start Time 1320   OT Stop Time 1400   OT Time Calculation (min) 40 min   Activity Tolerance Patient tolerated treatment well      Past Medical History  Diagnosis Date  . Arthritis   . Back pain   . Hot flashes   . Cancer     Breast  . Thyroid disease     Graves disease s/p RAI treatment  . Hypothyroidism   . Shortness of breath     with exertion  . Headache(784.0)   . GERD (gastroesophageal reflux disease)     mygestic herbal  . Insomnia   . Constipation   . History of blood transfusion     vein stripping in 70's  . Osteoporosis     Past Surgical History  Procedure Laterality Date  . Bunionectomy Bilateral     spurs  . Abdominal hysterectomy      TAH/BSO  . Varicose vein surgery    . Knee arthroscopy      left  . Mastectomy modified radical Bilateral 12/24/2012    LYMPH NODE BIOPSY   . Simple mastectomy with axillary sentinel node biopsy Bilateral 12/24/2012    Procedure: BILATERAL MASTECTOMY;  Surgeon: Odis Hollingshead, MD;  Location: [place];  Service: General;  Laterality: Bilateral;  . Axillary sentinel node biopsy Right 12/24/2012    Procedure: RIGHT AXILLARY SENTINEL LYMPH NODE BIOPSY;  Surgeon: Odis Hollingshead, MD;  Location: [place];  Service: General;  Laterality: Right;  NUCLEAR MEDICINE INJECTION - RIGHT SIDE  . Breast surgery Bilateral   . Tubal ligation      There were no vitals taken for this visit.  Visit Diagnosis:  Pain in joint, hand, unspecified laterality  Stiffness of hand joint, unspecified laterality  Generalized muscle weakness      Subjective Assessment - 09/03/14 1328    Symptoms Pt reports desire to d/c today due to  concerns with gas to come to therapy and pleased with progress.  I'll keep doing exercises.   Pain Score 8    Pain Location Hand   Pain Orientation Left;Right   Aggravating Factors  carrying and lifting objects   Pain Relieving Factors splints, heat            OT Treatments/Exercises (OP) - 09/03/14 0001    ADLs   General Comments Reviewed adaptive strategies/modification for ADLs and pt verbalized understanding.  Completed Quick DASH with 59.09% disability score.   Exercises   Exercises Hand  Grip stength:  42lbs bilaterally.  L lateral pinch:  10lbs   Modalities   Modalities Fluidotherapy   RUE Fluidotherapy   Number Minutes Fluidotherapy 15 Minutes   RUE Fluidotherapy Location Hand;Wrist   Comments for pain/stiffness with no adverse reactions.   LUE Fluidotherapy   Number Minutes Fluidotherapy 15 Minutes   LUE Fluidotherapy Location Hand;Wrist   Comments for pain/stiffness with no adverse reactions    Splinting   Splinting Modified L daytime splint to block 3rd digit MP flexion to decrease "locking" --rolled back around thumb for increased comfort.          OT Education - 09/03/14  37    Education provided Yes   Education Details Updated therapy HEP to red   Person(s) Educated Patient   Methods Explanation;Demonstration   Comprehension Verbalized understanding;Returned demonstration            OT Long Term Goals - Sep 04, 2014 1350    OT LONG TERM GOAL #1   Title Pt will verbalize understanding of activity modifications for ADLS/IADLS to decrease pain/increase eas prn   Status Achieved   OT LONG TERM GOAL #2   Title Pt will be independent with updated HEP   Status Achieved   OT LONG TERM GOAL #3   Title Pt will report pain less than or equal to 4/10 in Lt hand for light ADLS   Status Not Met  4-5/10 reported, fluctuates   OT LONG TERM GOAL #4   Title Pt will improve grip strength by at least 10 lbs bilaterally   Status Achieved  R-42lbs, improved by  14lbs.  L-42lbs, improved by 30lbs   OT LONG TERM GOAL #5   Title Pt will improve Lt lateral pinch strength by at least 2 lbs   Status Achieved  10lbs, improved by 4lbs   OT LONG TERM GOAL #6   Title Pt will improve LUE functional use as shown by improving Quick Dash Disability Score to 35% or less   Status Not Met  59.09%          Plan - 2014/09/04 1545    Clinical Impression Statement Pt met 3/5 STGs and 4/6 LTGs.   Plan d/c today at pt request.          G-Codes - 09/04/2014 1546    Functional Assessment Tool Used Quick DASH 59.09%   Functional Limitation Carrying, moving and handling objects   Carrying, Moving and Handling Objects Goal Status ([id number]) At least 20 percent but less than 40 percent impaired, limited or restricted   Carrying, Moving and Handling Objects Discharge Status [phone]) At least 40 percent but less than 60 percent impaired, limited or restricted      Problem List Patient Active Problem List   Diagnosis Date Noted  . 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 KNEE PAIN 02/26/2009  . ROTATOR CUFF SYNDROME 02/26/2009  . LOW BACK PAIN, CHRONIC 10/29/2008  . HYPOTHYROIDISM, POST-RADIOACTIVE IODINE 10/16/1988  . GRAVE'S DISEASE, HX OF 10/16/1978         OCCUPATIONAL THERAPY DISCHARGE SUMMARY  Remaining deficits: 1. Pain 2. Mild decreased strength--significantly improved 3. Hand stiffness/decreased ROM 4. Decreased functional use of hands    Education / Equipment: Pt instructed in activity modifications for ADLs/IADLs, HEP, splint wear/care (fitted for updated bilateral wrist cock-up splints, fabricated L 3rd digit splint to block MP flexion due to "locking").  Pt verbalized understanding of all education provided. Plan: Patient agrees to discharge.  Patient goals were not met. Patient is being discharged due to being pleased with the current functional level. (pt request).  ???                                       Vianne Bulls, OTR/L [facility] [place], [place]  [address] Phone:   [phone] Fax:  [place] 2014-09-04, 4:56 PM

## 2014-09-14 ENCOUNTER — Ambulatory Visit: Payer: Commercial Managed Care - HMO

## 2014-09-14 DIAGNOSIS — M545 Low back pain, unspecified: Secondary | ICD-10-CM

## 2014-09-14 DIAGNOSIS — M25562 Pain in left knee: Secondary | ICD-10-CM

## 2014-09-14 DIAGNOSIS — Z5189 Encounter for other specified aftercare: Secondary | ICD-10-CM | POA: Diagnosis not present

## 2014-09-14 DIAGNOSIS — M6281 Muscle weakness (generalized): Secondary | ICD-10-CM

## 2014-09-14 NOTE — Patient Instructions (Signed)
Kineseotape can be wet and to take off if irritating or in 3-4 days if feels good.

## 2014-09-14 NOTE — Therapy (Signed)
Physical Therapy Treatment  Patient Details  Name: Lauren Farley MRN: [id number] Date of Birth: [birth year]  Encounter Date: 09/14/2014      PT End of Session - 09/14/14 0927    Visit Number 5   Number of Visits 8   Date for PT Re-Evaluation 09/28/14   PT Start Time 0848   PT Stop Time 0945   PT Time Calculation (min) 57 min   Activity Tolerance Patient tolerated treatment well   Behavior During Therapy [facility] for tasks assessed/performed      Past Medical History  Diagnosis Date  . Arthritis   . Back pain   . Hot flashes   . Cancer     Breast  . Thyroid disease     Graves disease s/p RAI treatment  . Hypothyroidism   . Shortness of breath     with exertion  . Headache(784.0)   . GERD (gastroesophageal reflux disease)     mygestic herbal  . Insomnia   . Constipation   . History of blood transfusion     vein stripping in 70's  . Osteoporosis     Past Surgical History  Procedure Laterality Date  . Bunionectomy Bilateral     spurs  . Abdominal hysterectomy      TAH/BSO  . Varicose vein surgery    . Knee arthroscopy      left  . Mastectomy modified radical Bilateral 12/24/2012    LYMPH NODE BIOPSY   . Simple mastectomy with axillary sentinel node biopsy Bilateral 12/24/2012    Procedure: BILATERAL MASTECTOMY;  Surgeon: Odis Hollingshead, MD;  Location: [place];  Service: General;  Laterality: Bilateral;  . Axillary sentinel node biopsy Right 12/24/2012    Procedure: RIGHT AXILLARY SENTINEL LYMPH NODE BIOPSY;  Surgeon: Odis Hollingshead, MD;  Location: [place];  Service: General;  Laterality: Right;  NUCLEAR MEDICINE INJECTION - RIGHT SIDE  . Breast surgery Bilateral   . Tubal ligation      There were no vitals taken for this visit.  Visit Diagnosis:  Generalized muscle weakness  Bilateral low back pain without sciatica  Left knee pain      Subjective Assessment - 09/14/14 0854    Symptoms Have been doing well but in alot of pain today maybe from rainy  weather. Took some tylenol   Currently in Pain? Yes   Pain Score 8    Pain Location Knee  toes both feet   Pain Orientation Left   Pain Descriptors / Indicators Stabbing   Pain Type Chronic pain   Pain Onset More than a month ago   Pain Frequency Constant   Multiple Pain Sites Yes   Pain Location Toe (Comment which one)   Pain Orientation Right;Left   Pain Descriptors / Indicators Stabbing  8/10            [facility] Adult PT Treatment/Exercise - 09/14/14 0924    Moist Heat Therapy   Number Minutes Moist Heat 20 Minutes   Moist Heat Location --  LT knee   Electrical Stimulation   Electrical Stimulation Location LT knee anterior   Electrical Stimulation Action interferential   Electrical Stimulation Parameters intensity to PT tolerance   Electrical Stimulation Goals Pain   Ultrasound   Ultrasound Location LT knee   Ultrasound Parameters 100% 3 MHZ 1.2 Wcm2   Ultrasound Goals Pain   Manual Therapy   Manual Therapy Other (comment)   Other Manual Therapy kineseotape with 2 y 's around patella and 1  fand for edema medially          PT Education - 09/14/14 0927    Education provided Yes   Education Details tape   Person(s) Educated Patient   Methods Explanation   Comprehension Verbalized understanding              Plan - 09/14/14 0928    Clinical Impression Statement She was in increased pian and edema noted medial and supoerior to [place]. She declined exercise today   Pt will benefit from skilled therapeutic intervention in order to improve on the following deficits Pain;Decreased strength;Decreased range of motion   Rehab Potential Good   PT Frequency 2x / week   PT Duration 2 weeks   PT Treatment/Interventions Moist Heat;Ultrasound;Manual techniques   PT Next Visit Plan modalities if pain still high, exercise if not   PT Home Exercise Plan None today   Consulted and Agree with Plan of Care Patient   PT Plan Continue and advance HEP for strength LE and core          Problem List Patient Active Problem List   Diagnosis Date Noted  . 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 KNEE PAIN 02/26/2009  . ROTATOR CUFF SYNDROME 02/26/2009  . LOW BACK PAIN, CHRONIC 10/29/2008  . HYPOTHYROIDISM, POST-RADIOACTIVE IODINE 10/16/1988  . GRAVE'S DISEASE, HX OF 10/16/1978                                              Darrel Hoover PT 09/14/2014, 9:35 AM

## 2014-09-17 ENCOUNTER — Ambulatory Visit: Payer: Commercial Managed Care - HMO | Attending: Family Medicine

## 2014-09-17 DIAGNOSIS — G5602 Carpal tunnel syndrome, left upper limb: Secondary | ICD-10-CM | POA: Insufficient documentation

## 2014-09-17 DIAGNOSIS — M79643 Pain in unspecified hand: Secondary | ICD-10-CM | POA: Diagnosis not present

## 2014-09-17 DIAGNOSIS — G5601 Carpal tunnel syndrome, right upper limb: Secondary | ICD-10-CM | POA: Diagnosis not present

## 2014-09-17 DIAGNOSIS — Z5189 Encounter for other specified aftercare: Secondary | ICD-10-CM | POA: Insufficient documentation

## 2014-09-17 DIAGNOSIS — M25562 Pain in left knee: Secondary | ICD-10-CM

## 2014-09-17 DIAGNOSIS — M25649 Stiffness of unspecified hand, not elsewhere classified: Secondary | ICD-10-CM | POA: Insufficient documentation

## 2014-09-17 NOTE — Therapy (Signed)
Outpatient Rehabilitation [facility] [address] [place], [place], [address] Phone: [phone]   Fax:  [phone]  Physical Therapy Treatment  Patient Details  Name: Lauren Farley MRN: [id number] Date of Birth: Nov 20, 1945  Encounter Date: 09/17/2014      PT End of Session - 09/17/14 0920    Visit Number 6   Number of Visits 8   Date for PT Re-Evaluation 09/28/14   PT Start Time 0845   PT Stop Time 0910   PT Time Calculation (min) 25 min   Activity Tolerance Patient tolerated treatment well   Behavior During Therapy [facility] for tasks assessed/performed      Past Medical History  Diagnosis Date  . Arthritis   . Back pain   . Hot flashes   . Cancer     Breast  . Thyroid disease     Graves disease s/p RAI treatment  . Hypothyroidism   . Shortness of breath     with exertion  . Headache(784.0)   . GERD (gastroesophageal reflux disease)     mygestic herbal  . Insomnia   . Constipation   . History of blood transfusion     vein stripping in 70's  . Osteoporosis     Past Surgical History  Procedure Laterality Date  . Bunionectomy Bilateral     spurs  . Abdominal hysterectomy      TAH/BSO  . Varicose vein surgery    . Knee arthroscopy      left  . Mastectomy modified radical Bilateral 12/24/2012    LYMPH NODE BIOPSY   . Simple mastectomy with axillary sentinel node biopsy Bilateral 12/24/2012    Procedure: BILATERAL MASTECTOMY;  Surgeon: Odis Hollingshead, MD;  Location: [place];  Service: General;  Laterality: Bilateral;  . Axillary sentinel node biopsy Right 12/24/2012    Procedure: RIGHT AXILLARY SENTINEL LYMPH NODE BIOPSY;  Surgeon: Odis Hollingshead, MD;  Location: [place];  Service: General;  Laterality: Right;  NUCLEAR MEDICINE INJECTION - RIGHT SIDE  . Breast surgery Bilateral   . Tubal ligation      There were no vitals taken for this visit.  Visit Diagnosis:  Left knee pain          [facility] Adult PT Treatment/Exercise - 09/17/14 0911     Manual Therapy   Other Manual Therapy kineseotape with 2 y,s around patella and one y medial for medial knee pain. We discussed and I instructed her on taping technique and where she could purchase tape for use at home . I issued a small length of tape for 2 tapings in future at home. She declined exercise as she was having no pain. she will walk at home today and see if knee remains painfree .           PT Education - 09/17/14 0919    Education provided Yes   Education Details tape   Person(s) Educated Patient   Methods Explanation;Demonstration   Comprehension Verbalized understanding              Plan - 09/17/14 0920    Clinical Impression Statement she is painfree and relates this to knieseotape. she may benefit from self taping and not need more PT.    Pt will benefit from skilled therapeutic intervention in order to improve on the following deficits Pain;Decreased strength;Decreased range of motion   Rehab Potential Good   PT Frequency 1x / week   PT Duration 2 weeks   PT Treatment/Interventions --  Follow up with tape and HEP   PT Next Visit Plan modalities if pain still high, exercise if not   PT Home Exercise Plan REview   Consulted and Agree with Plan of Care Patient                               Problem List Patient Active Problem List   Diagnosis Date Noted  . 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 KNEE PAIN 02/26/2009  . ROTATOR CUFF SYNDROME 02/26/2009  . LOW BACK PAIN, CHRONIC 10/29/2008  . HYPOTHYROIDISM, POST-RADIOACTIVE IODINE 10/16/1988  . [place] DISEASE, HX OF 10/16/1978    Darrel Hoover PT 09/17/2014, 9:23 AM

## 2014-09-17 NOTE — Patient Instructions (Signed)
Lauren Farley instructed in removal of tape if irritating or in 4-5 days if beneficial. She was instrucetd in taping technique and where to purchase tape

## 2014-09-21 ENCOUNTER — Ambulatory Visit: Payer: Commercial Managed Care - HMO | Admitting: Physical Therapy

## 2014-09-21 DIAGNOSIS — Z5189 Encounter for other specified aftercare: Secondary | ICD-10-CM | POA: Diagnosis not present

## 2014-09-21 DIAGNOSIS — M25562 Pain in left knee: Secondary | ICD-10-CM

## 2014-09-21 NOTE — Patient Instructions (Signed)
How to tape knee.  Patient recorded on her phone. Also instructed things to do and not to do at the gym..   She declined posture and body mechanics information.  Already has.

## 2014-09-21 NOTE — Therapy (Signed)
Outpatient Rehabilitation [facility] [address] [place], [place], [address] Phone: [phone]   Fax:  [phone]  Physical Therapy Treatment  Patient Details  Name: Lauren Farley MRN: [id number] Date of Birth: [birth year]  Encounter Date: 09/21/2014      PT End of Session - 09/21/14 0930    Visit Number 7   Number of Visits 8   Date for PT Re-Evaluation 09/28/14   PT Start Time 4287   PT Stop Time 0925   PT Time Calculation (min) 38 min      Past Medical History  Diagnosis Date  . Arthritis   . Back pain   . Hot flashes   . Cancer     Breast  . Thyroid disease     Graves disease s/p RAI treatment  . Hypothyroidism   . Shortness of breath     with exertion  . Headache(784.0)   . GERD (gastroesophageal reflux disease)     mygestic herbal  . Insomnia   . Constipation   . History of blood transfusion     vein stripping in 70's  . Osteoporosis     Past Surgical History  Procedure Laterality Date  . Bunionectomy Bilateral     spurs  . Abdominal hysterectomy      TAH/BSO  . Varicose vein surgery    . Knee arthroscopy      left  . Mastectomy modified radical Bilateral 12/24/2012    LYMPH NODE BIOPSY   . Simple mastectomy with axillary sentinel node biopsy Bilateral 12/24/2012    Procedure: BILATERAL MASTECTOMY;  Surgeon: Odis Hollingshead, MD;  Location: [place];  Service: General;  Laterality: Bilateral;  . Axillary sentinel node biopsy Right 12/24/2012    Procedure: RIGHT AXILLARY SENTINEL LYMPH NODE BIOPSY;  Surgeon: Odis Hollingshead, MD;  Location: [place];  Service: General;  Laterality: Right;  NUCLEAR MEDICINE INJECTION - RIGHT SIDE  . Breast surgery Bilateral   . Tubal ligation      There were no vitals taken for this visit.  Visit Diagnosis:  Left knee pain      Subjective Assessment - 09/21/14 0925    Symptoms No pain in knee.  today is last day.  Wants to learn how to do taping.  No change in back pain            [facility]  Adult PT Treatment/Exercise - 09/21/14 0910    Knee/Hip Exercises: Supine   Other Supine Knee Exercises --  115 flexion, -5 degrees active ROM Lt knee.  painfree   Manual Therapy   Manual Therapy --  kinesiotex taping education.  How to do            PT Short Term Goals - 09/21/14 0919    PT SHORT TERM GOAL #1   Status Achieved   PT SHORT TERM GOAL #2   Status Achieved   PT SHORT TERM GOAL #3   Title sit with 25% less pain   Status Achieved   PT SHORT TERM GOAL #4   Title LBP decreased 25%   Time 2   Period Weeks   Status Not Met            Plan - 09/21/14 0931    Clinical Impression Statement All goals met except for back pain goal.  unchanged.   PT Plan Discharge, patient's request she is happy.  Problem List Patient Active Problem List   Diagnosis Date Noted  . 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 KNEE PAIN 02/26/2009  . ROTATOR CUFF SYNDROME 02/26/2009  . LOW BACK PAIN, CHRONIC 10/29/2008  . HYPOTHYROIDISM, POST-RADIOACTIVE IODINE 10/16/1988  . GRAVE'S DISEASE, HX OF 10/16/1978   Melvenia Needles, PTA 09/21/2014 9:34 AM Phone: [phone] Fax: [phone]  [facility] 09/21/2014, 9:34 AM   PHYSICAL THERAPY DISCHARGE SUMMARY  Visits from Start of Care: 7  Current functional level related to goals / functional outcomes: See Above. She was pain free with kineseotape and will tape at home   Remaining deficits: Continue intermittant mild pain and still limited with more loaded activity   Education / Equipment: Strength and kineseotape Plan: Patient agrees to discharge.  Patient goals were partially met. Patient is being discharged due to being pleased with the current functional level.  ?????    Pearson Forster, PT 09/21/2014 9:38 AM Phone: [phone] Fax: [phone]

## 2014-09-22 ENCOUNTER — Telehealth: Payer: Self-pay | Admitting: Family Medicine

## 2014-09-22 NOTE — Telephone Encounter (Signed)
Left a message for patient to come in and receive flu shot or call and update record if they have received the flu shot.

## 2014-10-26 ENCOUNTER — Telehealth: Payer: Self-pay | Admitting: *Deleted

## 2014-10-26 NOTE — Telephone Encounter (Signed)
Patient declined the flu shot.

## 2014-11-12 ENCOUNTER — Other Ambulatory Visit: Payer: Self-pay | Admitting: Plastic Surgery

## 2014-11-12 DIAGNOSIS — N6489 Other specified disorders of breast: Secondary | ICD-10-CM

## 2014-12-14 ENCOUNTER — Ambulatory Visit (INDEPENDENT_AMBULATORY_CARE_PROVIDER_SITE_OTHER): Payer: Commercial Managed Care - HMO | Admitting: Internal Medicine

## 2014-12-14 ENCOUNTER — Ambulatory Visit (INDEPENDENT_AMBULATORY_CARE_PROVIDER_SITE_OTHER): Payer: Medicare HMO

## 2014-12-14 VITALS — BP 142/94 | HR 92 | Temp 98.0°F | Resp 18

## 2014-12-14 DIAGNOSIS — M79601 Pain in right arm: Secondary | ICD-10-CM

## 2014-12-14 DIAGNOSIS — R109 Unspecified abdominal pain: Secondary | ICD-10-CM

## 2014-12-14 DIAGNOSIS — R0789 Other chest pain: Secondary | ICD-10-CM

## 2014-12-14 DIAGNOSIS — R079 Chest pain, unspecified: Secondary | ICD-10-CM

## 2014-12-14 DIAGNOSIS — E039 Hypothyroidism, unspecified: Secondary | ICD-10-CM | POA: Diagnosis not present

## 2014-12-14 LAB — POCT URINALYSIS DIPSTICK
Bilirubin, UA: NEGATIVE
GLUCOSE UA: NEGATIVE
Ketones, UA: NEGATIVE
Leukocytes, UA: NEGATIVE
NITRITE UA: NEGATIVE
Protein, UA: NEGATIVE
Spec Grav, UA: 1.015
UROBILINOGEN UA: 0.2
pH, UA: 6

## 2014-12-14 LAB — POCT UA - MICROSCOPIC ONLY
CASTS, UR, LPF, POC: NEGATIVE
Crystals, Ur, HPF, POC: NEGATIVE
Mucus, UA: NEGATIVE
Yeast, UA: NEGATIVE

## 2014-12-14 LAB — POCT CBC
GRANULOCYTE PERCENT: 52.8 % (ref 37–80)
HCT, POC: 44.2 % (ref 37.7–47.9)
HEMOGLOBIN: 13.9 g/dL (ref 12.2–16.2)
Lymph, poc: 2.7 (ref 0.6–3.4)
MCH, POC: 28.5 pg (ref 27–31.2)
MCHC: 31.4 g/dL — AB (ref 31.8–35.4)
MCV: 90.7 fL (ref 80–97)
MID (cbc): 0.2 (ref 0–0.9)
MPV: 9.1 fL (ref 0–99.8)
POC GRANULOCYTE: 3.3 (ref 2–6.9)
POC LYMPH %: 43.6 % (ref 10–50)
POC MID %: 3.6 %M (ref 0–12)
Platelet Count, POC: 188 10*3/uL (ref 142–424)
RBC: 4.87 M/uL (ref 4.04–5.48)
RDW, POC: 13.9 %
WBC: 6.3 10*3/uL (ref 4.6–10.2)

## 2014-12-14 LAB — POCT SEDIMENTATION RATE: POCT SED RATE: 29 mm/hr — AB (ref 0–22)

## 2014-12-14 MED ORDER — IBUPROFEN 600 MG PO TABS: 600.0000 mg | ORAL_TABLET | Freq: Three times a day (TID) | ORAL | Status: DC | PRN

## 2014-12-14 MED ORDER — DICLOFENAC SODIUM 1 % TD GEL: 4.0000 g | Freq: Four times a day (QID) | TRANSDERMAL | Status: DC

## 2014-12-14 MED ORDER — OMEPRAZOLE 40 MG PO CPDR: 40.0000 mg | DELAYED_RELEASE_CAPSULE | Freq: Every day | ORAL | Status: DC

## 2014-12-14 NOTE — Patient Instructions (Signed)
Osteoarthritis Osteoarthritis is a disease that causes soreness and inflammation of a joint. It occurs when the cartilage at the affected joint wears down. Cartilage acts as a cushion, covering the ends of bones where they meet to form a joint. Osteoarthritis is the most common form of arthritis. It often occurs in older people. The joints affected most often by this condition include those in the:  Ends of the fingers.  Thumbs.  Neck.  Lower back.  Knees.  Hips. CAUSES  Over time, the cartilage that covers the ends of bones begins to wear away. This causes bone to rub on bone, producing pain and stiffness in the affected joints.  RISK FACTORS Certain factors can increase your chances of having osteoarthritis, including:  Older age.  Excessive body weight.  Overuse of joints.  Previous joint injury. SIGNS AND SYMPTOMS   Pain, swelling, and stiffness in the joint.  Over time, the joint may lose its normal shape.  Small deposits of bone (osteophytes) may grow on the edges of the joint.  Bits of bone or cartilage can break off and float inside the joint space. This may cause more pain and damage. DIAGNOSIS  Your health care provider will do a physical exam and ask about your symptoms. Various tests may be ordered, such as:  X-rays of the affected joint.  An MRI scan.  Blood tests to rule out other types of arthritis.  Joint fluid tests. This involves using a needle to draw fluid from the joint and examining the fluid under a microscope. TREATMENT  Goals of treatment are to control pain and improve joint function. Treatment plans may include:  A prescribed exercise program that allows for rest and joint relief.  A weight control plan.  Pain relief techniques, such as:  Properly applied heat and cold.  Electric pulses delivered to nerve endings under the skin (transcutaneous electrical nerve stimulation [TENS]).  Massage.  Certain nutritional  supplements.  Medicines to control pain, such as:  Acetaminophen.  Nonsteroidal anti-inflammatory drugs (NSAIDs), such as naproxen.  Narcotic or central-acting agents, such as tramadol.  Corticosteroids. These can be given orally or as an injection.  Surgery to reposition the bones and relieve pain (osteotomy) or to remove loose pieces of bone and cartilage. Joint replacement may be needed in advanced states of osteoarthritis. HOME CARE INSTRUCTIONS   Take medicines only as directed by your health care provider.  Maintain a healthy weight. Follow your health care provider's instructions for weight control. This may include dietary instructions.  Exercise as directed. Your health care provider can recommend specific types of exercise. These may include:  Strengthening exercises. These are done to strengthen the muscles that support joints affected by arthritis. They can be performed with weights or with exercise bands to add resistance.  Aerobic activities. These are exercises, such as brisk walking or low-impact aerobics, that get your heart pumping.  Range-of-motion activities. These keep your joints limber.  Balance and agility exercises. These help you maintain daily living skills.  Rest your affected joints as directed by your health care provider.  Keep all follow-up visits as directed by your health care provider. SEEK MEDICAL CARE IF:   Your skin turns red.  You develop a rash in addition to your joint pain.  You have worsening joint pain.  You have a fever along with joint or muscle aches. SEEK IMMEDIATE MEDICAL CARE IF:  You have a significant loss of weight or appetite.  You have night sweats. FOR MORE  [place] of Arthritis and Musculoskeletal and Skin Diseases: www.niams.SouthExposed.es  [employer] on Aging: http://kim-miller.com/  American College of Rheumatology: www.rheumatology.org Document Released: 10/02/2005 Document Revised:  02/16/2014 Document Reviewed: 06/09/2013 [facility] Patient Information 2015 [place], [place]. This information is not intended to replace advice given to you by your health care provider. Make sure you discuss any questions you have with your health care provider. Gastroesophageal Reflux Disease, Adult Gastroesophageal reflux disease (GERD) happens when acid from your stomach flows up into the esophagus. When acid comes in contact with the esophagus, the acid causes soreness (inflammation) in the esophagus. Over time, GERD may create small holes (ulcers) in the lining of the esophagus. CAUSES   Increased body weight. This puts pressure on the stomach, making acid rise from the stomach into the esophagus.  Smoking. This increases acid production in the stomach.  Drinking alcohol. This causes decreased pressure in the lower esophageal sphincter (valve or ring of muscle between the esophagus and stomach), allowing acid from the stomach into the esophagus.  Late evening meals and a full stomach. This increases pressure and acid production in the stomach.  A malformed lower esophageal sphincter. Sometimes, no cause is found. SYMPTOMS   Burning pain in the lower part of the mid-chest behind the breastbone and in the mid-stomach area. This may occur twice a week or more often.  Trouble swallowing.  Sore throat.  Dry cough.  Asthma-like symptoms including chest tightness, shortness of breath, or wheezing. DIAGNOSIS  Your caregiver may be able to diagnose GERD based on your symptoms. In some cases, X-rays and other tests may be done to check for complications or to check the condition of your stomach and esophagus. TREATMENT  Your caregiver may recommend over-the-counter or prescription medicines to help decrease acid production. Ask your caregiver before starting or adding any new medicines.  HOME CARE INSTRUCTIONS   Change the factors that you can control. Ask your caregiver for guidance  concerning weight loss, quitting smoking, and alcohol consumption.  Avoid foods and drinks that make your symptoms worse, such as:  Caffeine or alcoholic drinks.  Chocolate.  Peppermint or mint flavorings.  Garlic and onions.  Spicy foods.  Citrus fruits, such as oranges, lemons, or limes.  Tomato-based foods such as sauce, chili, salsa, and pizza.  Fried and fatty foods.  Avoid lying down for the 3 hours prior to your bedtime or prior to taking a nap.  Eat small, frequent meals instead of large meals.  Wear loose-fitting clothing. Do not wear anything tight around your waist that causes pressure on your stomach.  Raise the head of your bed 6 to 8 inches with wood blocks to help you sleep. Extra pillows will not help.  Only take over-the-counter or prescription medicines for pain, discomfort, or fever as directed by your caregiver.  Do not take aspirin, ibuprofen, or other nonsteroidal anti-inflammatory drugs (NSAIDs). SEEK IMMEDIATE MEDICAL CARE IF:   You have pain in your arms, neck, jaw, teeth, or back.  Your pain increases or changes in intensity or duration.  You develop nausea, vomiting, or sweating (diaphoresis).  You develop shortness of breath, or you faint.  Your vomit is green, yellow, black, or looks like coffee grounds or blood.  Your stool is red, bloody, or black. These symptoms could be signs of other problems, such as heart disease, gastric bleeding, or esophageal bleeding. MAKE SURE YOU:   Understand these instructions.  Will watch your condition.  Will get help right  away if you are not doing well or get worse. Document Released: 07/12/2005 Document Revised: 12/25/2011 Document Reviewed: 04/21/2011 [facility] Patient Information 2015 [place], [place]. This information is not intended to replace advice given to you by your health care provider. Make sure you discuss any questions you have with your health care provider.

## 2014-12-14 NOTE — Progress Notes (Signed)
Subjective:    Patient ID: Lauren Farley, female    DOB: 10/10/1946, 69 y.o.   MRN: [id number]  HPI Emergency chest pain , patient of Dr. Manya Silvas. Has right arm and shoulder burning pain, left flank pain, HA, pain in multiple joints. Also has spells of sweating all present fornthe last 2 weeks. No syncope, dizzy, sob. Has hx of bilateral breast cancer and mastectomies. No urinary sxs, nausea, vomiting. She does not appear acutely ill. Appears in frustrated pain, chronic  Review of Systems Past hx Graves disease and elevated c-reactive protein    Objective:   Physical Exam  Constitutional: She is oriented to person, place, and time. She appears well-developed and well-nourished. She appears distressed.  HENT:  Head: Normocephalic.  Right Ear: External ear normal.  Left Ear: External ear normal.  Eyes: Conjunctivae and EOM are normal. Pupils are equal, round, and reactive to light.  Neck: Normal range of motion. Neck supple.  Cardiovascular: Normal rate, regular rhythm and normal heart sounds.  Exam reveals no gallop and no friction rub.   No murmur heard. Pulmonary/Chest: Effort normal and breath sounds normal. No respiratory distress. She has no wheezes. She exhibits tenderness.  Abdominal: Soft. She exhibits no distension and no mass. There is tenderness. There is no rebound and no guarding.  Musculoskeletal: She exhibits tenderness.       Right shoulder: She exhibits tenderness, bony tenderness, pain and spasm. She exhibits normal range of motion, no swelling, no effusion, no crepitus, no deformity, no laceration, normal pulse and normal strength.       Arms: Neurological: She is alert and oriented to person, place, and time. No cranial nerve deficit. She exhibits normal muscle tone. Coordination normal.  Skin: No rash noted.  Psychiatric: She has a normal mood and affect. Her behavior is normal. Thought content normal.   EKG normal [facility] Policy for Prescribing Controlled  Substances (Revised 08/2012) 1. Prescriptions for controlled substances will be filled by ONE provider at [facility] with whom you have established and developed a plan for your care, including follow-up. 2. You are encouraged to schedule an appointment with your prescriber at our appointment center for follow-up visits whenever possible. 3. If you request a prescription for the controlled substance while at [facility] for an acute problem (with someone other than your regular prescriber), you MAY be given a ONE-TIME prescription for a 30-day supply of the controlled substance, to allow time for you to return to see your regular prescriber for additional prescriptions.  [facility] reading (PRIMARY) by  Dr.Renlee Floor cxr and aas normal, right ac joint moderate degnerative changes.  Results for orders placed or performed in visit on 12/14/14  POCT CBC  Result Value Ref Range   WBC 6.3 4.6 - 10.2 K/uL   Lymph, poc 2.7 0.6 - 3.4   POC LYMPH PERCENT 43.6 10 - 50 %L   MID (cbc) 0.2 0 - 0.9   POC MID % 3.6 0 - 12 %M   POC Granulocyte 3.3 2 - 6.9   Granulocyte percent 52.8 37 - 80 %G   RBC 4.87 4.04 - 5.48 M/uL   Hemoglobin 13.9 12.2 - 16.2 g/dL   HCT, POC 44.2 37.7 - 47.9 %   MCV 90.7 80 - 97 fL   MCH, POC 28.5 27 - 31.2 pg   MCHC 31.4 (A) 31.8 - 35.4 g/dL   RDW, POC 13.9 %   Platelet Count, POC 188 142 - 424 K/uL   MPV 9.1 0 -  99.8 fL  POCT urinalysis dipstick  Result Value Ref Range   Color, UA yellow    Clarity, UA clear    Glucose, UA neg    Bilirubin, UA neg    Ketones, UA neg    Spec Grav, UA 1.015    Blood, UA trace-intact    pH, UA 6.0    Protein, UA neg    Urobilinogen, UA 0.2    Nitrite, UA neg    Leukocytes, UA Negative   POCT UA - Microscopic Only  Result Value Ref Range   WBC, Ur, HPF, POC 0-1    RBC, urine, microscopic 0-2    Bacteria, U Microscopic trace    Mucus, UA neg    Epithelial cells, urine per micros 0-1    Crystals, Ur, HPF, POC neg    Casts, Ur, LPF, POC neg    Yeast, UA  neg           Assessment & Plan:  Right shoulder pain/chest pain/Left flank pain--sub acute to chronic Post bilateral mastectomies/Chest pain Need close follow up with Dr. Brigitte Pulse Trial of PPI and Nsaids for arthritis and chest pain Heart and lungs appear healthy

## 2014-12-15 LAB — COMPREHENSIVE METABOLIC PANEL
ALBUMIN: 4.2 g/dL (ref 3.5–5.2)
ALT: 16 U/L (ref 0–35)
AST: 19 U/L (ref 0–37)
Alkaline Phosphatase: 108 U/L (ref 39–117)
BUN: 13 mg/dL (ref 6–23)
CO2: 26 mEq/L (ref 19–32)
Calcium: 10.1 mg/dL (ref 8.4–10.5)
Chloride: 105 mEq/L (ref 96–112)
Creat: 0.85 mg/dL (ref 0.50–1.10)
Glucose, Bld: 78 mg/dL (ref 70–99)
POTASSIUM: 4.1 meq/L (ref 3.5–5.3)
Sodium: 139 mEq/L (ref 135–145)
Total Bilirubin: 0.6 mg/dL (ref 0.2–1.2)
Total Protein: 6.8 g/dL (ref 6.0–8.3)

## 2014-12-15 LAB — LIPASE: Lipase: 59 U/L (ref 0–75)

## 2014-12-15 LAB — TSH: TSH: 1.376 u[IU]/mL (ref 0.350–4.500)

## 2014-12-16 ENCOUNTER — Encounter: Payer: Self-pay | Admitting: Family Medicine

## 2014-12-31 ENCOUNTER — Encounter (HOSPITAL_BASED_OUTPATIENT_CLINIC_OR_DEPARTMENT_OTHER): Admission: RE | Payer: Self-pay | Source: Ambulatory Visit

## 2014-12-31 ENCOUNTER — Ambulatory Visit (HOSPITAL_BASED_OUTPATIENT_CLINIC_OR_DEPARTMENT_OTHER): Admission: RE | Admit: 2014-12-31 | Payer: Medicare HMO | Source: Ambulatory Visit | Admitting: Plastic Surgery

## 2014-12-31 SURGERY — BREAST REDUCTION WITH LIPOSUCTION
Anesthesia: General | Site: Chest | Laterality: Bilateral

## 2015-01-20 ENCOUNTER — Telehealth: Payer: Self-pay | Admitting: Family Medicine

## 2015-01-20 NOTE — Telephone Encounter (Signed)
Received fax from [facility] on 3/31 requesting surgical clearance for right rotator cuff repair.  Pt will need to be seen in office for this - I have not seen pt is >7 mos and pt was last seen in clinic 6 wks prior by Dr. Elder Cyphers for chest pain - ok to sched or to come to walk-in - which ever pt prefers.

## 2015-01-20 NOTE — Telephone Encounter (Signed)
lmom to cb. 

## 2015-01-21 ENCOUNTER — Telehealth: Payer: Self-pay | Admitting: Radiology

## 2015-01-21 MED ORDER — MELOXICAM 7.5 MG PO TABS: 7.5000 mg | ORAL_TABLET | Freq: Every day | ORAL | Status: DC

## 2015-01-21 NOTE — Telephone Encounter (Signed)
Called to get records from [employer] (MRI). They are faxing a release of info over for pt to sign before they will release this to [place]. Called pt and lmom to let her know that she needs to come in and sign release.

## 2015-01-21 NOTE — Telephone Encounter (Signed)
lmom to cb. 

## 2015-01-21 NOTE — Telephone Encounter (Signed)
I cannot tell her whether to have surgery - this is a decision that can only be made by a specialist who is intimately familiar with the surgery and I trust Dr. Archie Endo clinical decision making. However, a lot of surgeries don't have to be rushed into and most chronic shoulder problems will not significantly worsen by pursuing PT in the meantime. Good to get a radiology read of her MRI scanned into her epic chart for continuity's sake though.  And glad I don't have to worry about her surgical clearance form for now at least. In the future, I think that asking for a second opinion is never wrong - different providers approach problems in different ways - and so if she would like a referral to a different orthopedist before deciding about surgery that is fine. Is she going to PT already or does she need a referral? If she needs referral does she have a preference where?  Meloxicam refilled though she should not stay on this for to much longer - bad for the kidneys, HTN, heart disease. In the future, pt may want to consider a second orthopedist opinion if she continues t have any pain or limitations of motion but we can discuss further at her next OV.

## 2015-01-21 NOTE — Telephone Encounter (Signed)
Spoke with pt--she is absolutely against having surgery. She says she doesn't feel she needs it. She said she had told Dr Noemi Chapel to send you her MRI to look at and see what you think because she thinks she could get better with meds and PT. I will call [facility] and have them fax over MRI. Pt also said she would like a refill of her Meloxicam.

## 2015-01-28 ENCOUNTER — Other Ambulatory Visit: Payer: Self-pay

## 2015-01-28 NOTE — Telephone Encounter (Signed)
Pt of Dr. Brigitte Pulse requesting rx for a shoulder brace, please advise pt

## 2015-01-28 NOTE — Telephone Encounter (Signed)
Yes, fine to prescribe shoulder brace due to shoulder pain

## 2015-01-28 NOTE — Telephone Encounter (Signed)
Can we Rx? Rx pended if it is ok.

## 2015-02-23 MED ORDER — UNABLE TO FIND
Status: DC
Start: 2015-02-23 — End: 2015-02-24

## 2015-02-23 NOTE — Telephone Encounter (Signed)
It is impossible to electronically prescribe this - please contact pt to see exactly what she wants - usually the DME company can send me a fax to sign but this needs to be deleted.

## 2015-02-24 NOTE — Telephone Encounter (Signed)
Deleted

## 2015-02-24 NOTE — Addendum Note (Signed)
Addended by: Jannette Spanner on: 02/24/2015 08:30 AM   Modules accepted: Orders, Medications

## 2015-02-24 NOTE — Telephone Encounter (Signed)
Called pt. Left message for pt to call back.  

## 2015-02-24 NOTE — Telephone Encounter (Signed)
Dr. Brigitte Pulse, I spoke with pt in depth about this. She feels her doctor at [facility] is just wanting to do surgery and she does not want to do this. She would like to see another orthopedist for another opinion. Can we refer? Do you have another one in mind? The shoulder brace is to make her comfortable. Please advise.

## 2015-02-26 NOTE — Telephone Encounter (Signed)
I signed a hard rx for a shoulder brace for her but she should check w/ ortho before purchasing anything. Shoulder braces are usually NOT encouraged unless immed following surgery as they can easily cause frozen shoulder and make problems worse in the long run - which is not really a concern for any other joint.  If she does choose to wear a shoulder brace, she needs to make sure she takes it off to do full range of motion stretches several times a day so she doesn't loose more mobility.  I do trust the orthopedist that she saw but she should absolutely feel the same - esp before she undertakes something as costly and invasive as surgery - so happy to refer her.  It would be good for her to get an opinion from an orthopedist who doesn't do surgery at all - that way if they agree that she could benefit from surgery she knows for sure it will be a good option for her to consider more seriously - there are many great doctors -  Dr. Rip Harbour or Dr. Mina Marble at [facility] or Dr. Nelva Bush, Lorre Nick, or Drema Dallas at [facility] are all either trained in physical medicine and rehabilitation or sports medicine - none are surgeons - so please place referral to wherever pt prefers.  Thanks.

## 2015-03-01 ENCOUNTER — Encounter: Payer: Self-pay | Admitting: *Deleted

## 2015-03-01 NOTE — Telephone Encounter (Signed)
PATIENT WOULD LIKE TO GO SEE Dr Conan Bowens please refer her

## 2015-03-01 NOTE — Assessment & Plan Note (Signed)
Right shoulder acute traumatic rotator cuff tear and right thigh acute traumatic muscular strain.  Proceed with right shoulder arthroscopy and rotator cuff repair.

## 2015-03-01 NOTE — Care Management (Signed)
Patient has agreed to participate with [facility], which provides care management for chronically ill members & support for their caregivers.    Call Signe Colt, patient's care manager at [phone] with questions, updates or health information for your patient.

## 2015-03-29 ENCOUNTER — Telehealth: Payer: Self-pay

## 2015-03-29 DIAGNOSIS — M75101 Unspecified rotator cuff tear or rupture of right shoulder, not specified as traumatic: Secondary | ICD-10-CM

## 2015-03-29 NOTE — Telephone Encounter (Signed)
Patient would like a referral to see Dr. Justice Britain. He does shoulder surgery. Patient phone: [phone]

## 2015-03-29 NOTE — Telephone Encounter (Addendum)
Referral placed - please make sure pt has a cd of her shoulder MRI done at [facility] to bring w/ her to the [facility] ortho appt.

## 2015-07-09 ENCOUNTER — Encounter: Payer: Self-pay | Admitting: Family Medicine

## 2015-07-29 ENCOUNTER — Other Ambulatory Visit: Payer: Self-pay | Admitting: Family Medicine

## 2015-08-13 ENCOUNTER — Other Ambulatory Visit: Payer: Self-pay | Admitting: Family Medicine

## 2015-09-15 ENCOUNTER — Telehealth: Payer: Self-pay | Admitting: Family Medicine

## 2015-09-15 NOTE — Telephone Encounter (Signed)
SPOKE WITH PATIENT AND SHE IS NO LONGER USING DR Brigitte Pulse AS HER PRIMARY CARE.  SHE HAS NOTIFIED [employer] AND THEY HAVE SWITCHED HER CARD OVER TO HER NEW PROVIDER.

## 2016-03-07 ENCOUNTER — Encounter (HOSPITAL_BASED_OUTPATIENT_CLINIC_OR_DEPARTMENT_OTHER): Payer: Self-pay | Admitting: *Deleted

## 2016-03-20 ENCOUNTER — Other Ambulatory Visit: Payer: Self-pay | Admitting: Plastic Surgery

## 2016-03-20 DIAGNOSIS — N6489 Other specified disorders of breast: Secondary | ICD-10-CM

## 2016-03-20 DIAGNOSIS — Z853 Personal history of malignant neoplasm of breast: Secondary | ICD-10-CM

## 2016-03-20 NOTE — H&P (Signed)
Lauren Farley is an 70 y.o. female.   Chief Complaint: history of breast cancer, breast asymmetry HPI: The patient is a 70 yrs old bf here for excision and liposuction of bilateral excess tissue at the lateral breast and axilla. She underwent bilateral mastectomies (12/2012) for a right breast cancer with axillary SLNB 12/2012 (T1N0). She did not have chemo or radiation. A core needle biopsy showed a right ductal carcinoma in situ with papillary features and a suspicion of possible invasive disease. The tumor was ER/PR positive with a proliferation marker Ki-67 6%. 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 She has arthritis, thyroid disease, and hot flashes. She underwent a bunionectomy, abdominal hysterectomy, knee surgery and bilateral mastectomies. She is not interested in implants or autologous reconstruction. She would like to have the excess skin removed so she can fit into the bra and prosthetics better. She is 5 feet 6 inches and weighs 225 pounds. She is mostly interested in the lateral area. We want to leave a little shelf at the inframammary fold so the bra will stay in place better. She was seen over a year ago but had a shoulder injury so her surgery was delayed. She has slightly bigger and more tissue on the left compared to the right. She is very unhappy with the deformed look as and the way she feels in clothes. There is some soreness of the skin due to the rubbing on the skin. She is in stable health at this time. She seems to have reasonable expectations.  History:   Past Medical History  Diagnosis Date  . Back pain   . Hot flashes   . Cancer ([place])     Breast  . Thyroid disease     Graves disease s/p RAI treatment  . Hypothyroidism   . Insomnia   . Constipation   . History of blood transfusion     vein stripping in 70's  . Osteoporosis   . GERD (gastroesophageal reflux disease)     mygestic herbal  . Arthritis     lt knrr, bil feet,  bil hands, back    Past Surgical History  Procedure Laterality Date  . Bunionectomy Bilateral     spurs  . Abdominal hysterectomy      TAH/BSO  . Varicose vein surgery    . Knee arthroscopy      left  . Mastectomy modified radical Bilateral 12/24/2012    LYMPH NODE BIOPSY   . Simple mastectomy with axillary sentinel node biopsy Bilateral 12/24/2012    Procedure: BILATERAL MASTECTOMY;  Surgeon: Odis Hollingshead, MD;  Location: [facility];  Service: General;  Laterality: Bilateral;  . Axillary sentinel node biopsy Right 12/24/2012    Procedure: RIGHT AXILLARY SENTINEL LYMPH NODE BIOPSY;  Surgeon: Odis Hollingshead, MD;  Location: [facility];  Service: General;  Laterality: Right;  NUCLEAR MEDICINE INJECTION - RIGHT SIDE  . Breast surgery Bilateral   . Tubal ligation      Family History  Problem Relation Age of Onset  . Heart disease Sister   . Hypertension Sister   . Hypertension Brother   . Hypertension Sister    Social History:  reports that she has quit smoking. She has never used smokeless tobacco. She reports that she drinks alcohol. She reports that she does not use illicit drugs.  Allergies: No Known Allergies   (Not in a hospital admission)  No results found for this or any previous visit (from the past  48 hour(s)). No results found.  Review of Systems  Constitutional: Negative.   HENT: Negative.   Eyes: Negative.   Respiratory: Negative.   Cardiovascular: Negative.   Gastrointestinal: Negative.   Genitourinary: Negative.   Musculoskeletal: Negative.   Skin: Negative.   Neurological: Negative.   Psychiatric/Behavioral: Negative.     There were no vitals taken for this visit. Physical Exam  Constitutional: She is oriented to person, place, and time. She appears well-developed and well-nourished.  HENT:  Head: Normocephalic and atraumatic.  Eyes: Conjunctivae and EOM are normal. Pupils are equal, round, and reactive to light.  Cardiovascular: Normal rate.    Respiratory: Effort normal. No respiratory distress. She has no wheezes.  GI: Soft. She exhibits no distension. There is no tenderness.  Neurological: She is alert and oriented to person, place, and time.  Skin: Skin is warm.  Psychiatric: She has a normal mood and affect. Her behavior is normal. Judgment and thought content normal.     Assessment/Plan Plan excision of liposuction of bilateral lateral breast and axilla.  The risks that can be encountered with and after liposuction were discussed and include the following but no limited to these:  Asymmetry, fluid accumulation, firmness of the area, fat necrosis with death of fat tissue, bleeding, infection, delayed healing, anesthesia risks, skin sensation changes, injury to structures including nerves, blood vessels, and muscles which may be temporary or permanent, allergies to tape, suture materials and glues, blood products, topical preparations or injected agents, skin and contour irregularities, skin discoloration and swelling, deep vein thrombosis, cardiac and pulmonary complications, pain, which may persist, persistent pain, recurrence of the lesion, poor healing of the incision, possible need for revisional surgery or staged procedures. Thiere can also be persistent swelling, poor wound healing, rippling or loose skin, worsening of cellulite, swelling, and thermal burn or heat injury from ultrasound with the ultrasound-assisted lipoplasty technique. Any change in weight fluctuations can alter the outcome. The patient desires to proceed and consent was obtained.   CLAIRE S DILLINGHAM, DO 03/20/2016, 8:22 PM    

## 2016-03-23 ENCOUNTER — Encounter (HOSPITAL_BASED_OUTPATIENT_CLINIC_OR_DEPARTMENT_OTHER): Payer: Self-pay | Admitting: Plastic Surgery

## 2016-03-23 ENCOUNTER — Ambulatory Visit (HOSPITAL_BASED_OUTPATIENT_CLINIC_OR_DEPARTMENT_OTHER): Payer: Medicare HMO | Admitting: Anesthesiology

## 2016-03-23 ENCOUNTER — Encounter (HOSPITAL_BASED_OUTPATIENT_CLINIC_OR_DEPARTMENT_OTHER)
Admission: RE | Disposition: A | Discharge status: Home / Self Care | Payer: Self-pay | Source: Ambulatory Visit | Attending: Plastic Surgery

## 2016-03-23 ENCOUNTER — Ambulatory Visit (HOSPITAL_BASED_OUTPATIENT_CLINIC_OR_DEPARTMENT_OTHER)
Admission: RE | Admit: 2016-03-23 | Discharge: 2016-03-23 | Disposition: A | Discharge status: Home / Self Care | Payer: Medicare HMO | Source: Ambulatory Visit | Attending: Plastic Surgery | Admitting: Plastic Surgery

## 2016-03-23 DIAGNOSIS — M1990 Unspecified osteoarthritis, unspecified site: Secondary | ICD-10-CM | POA: Insufficient documentation

## 2016-03-23 DIAGNOSIS — Z9013 Acquired absence of bilateral breasts and nipples: Secondary | ICD-10-CM | POA: Diagnosis not present

## 2016-03-23 DIAGNOSIS — Z87891 Personal history of nicotine dependence: Secondary | ICD-10-CM | POA: Insufficient documentation

## 2016-03-23 DIAGNOSIS — Z853 Personal history of malignant neoplasm of breast: Secondary | ICD-10-CM | POA: Diagnosis not present

## 2016-03-23 DIAGNOSIS — K219 Gastro-esophageal reflux disease without esophagitis: Secondary | ICD-10-CM | POA: Insufficient documentation

## 2016-03-23 DIAGNOSIS — N6489 Other specified disorders of breast: Secondary | ICD-10-CM

## 2016-03-23 DIAGNOSIS — E039 Hypothyroidism, unspecified: Secondary | ICD-10-CM | POA: Diagnosis not present

## 2016-03-23 HISTORY — PX: LIPOSUCTION: SHX10

## 2016-03-23 HISTORY — PX: BREAST REDUCTION SURGERY: SHX8

## 2016-03-23 SURGERY — MAMMOPLASTY, REDUCTION
Anesthesia: General | Site: Breast | Laterality: Bilateral

## 2016-03-23 MED ORDER — OXYCODONE HCL 5 MG PO TABS: 5.0000 mg | ORAL_TABLET | Freq: Once | ORAL | Status: DC | PRN

## 2016-03-23 MED ORDER — LIDOCAINE HCL (CARDIAC) 10 MG/ML IV SOLN
INTRAVENOUS | Status: DC | PRN
  Administered 2016-03-23: 60 mg via INTRAVENOUS

## 2016-03-23 MED ORDER — FENTANYL CITRATE (PF) 100 MCG/2ML IJ SOLN
INTRAMUSCULAR | Status: AC
  Filled 2016-03-23: qty 2

## 2016-03-23 MED ORDER — FENTANYL CITRATE (PF) 100 MCG/2ML IJ SOLN
50.0000 ug | INTRAMUSCULAR | Status: AC | PRN
  Administered 2016-03-23: 50 ug via INTRAVENOUS
  Administered 2016-03-23 (×2): 100 ug via INTRAVENOUS

## 2016-03-23 MED ORDER — ONDANSETRON HCL 4 MG/2ML IJ SOLN
INTRAMUSCULAR | Status: AC
  Filled 2016-03-23: qty 2

## 2016-03-23 MED ORDER — HYDROMORPHONE HCL 1 MG/ML IJ SOLN
0.2500 mg | INTRAMUSCULAR | Status: DC | PRN
  Administered 2016-03-23 (×2): 0.5 mg via INTRAVENOUS

## 2016-03-23 MED ORDER — ONDANSETRON HCL 4 MG/2ML IJ SOLN: 4.0000 mg | Freq: Four times a day (QID) | INTRAMUSCULAR | Status: DC | PRN

## 2016-03-23 MED ORDER — MIDAZOLAM HCL 2 MG/2ML IJ SOLN
1.0000 mg | INTRAMUSCULAR | Status: DC | PRN
  Administered 2016-03-23: 2 mg via INTRAVENOUS

## 2016-03-23 MED ORDER — DEXAMETHASONE SODIUM PHOSPHATE 4 MG/ML IJ SOLN
INTRAMUSCULAR | Status: DC | PRN
  Administered 2016-03-23: 10 mg via INTRAVENOUS

## 2016-03-23 MED ORDER — PROPOFOL 10 MG/ML IV BOLUS
INTRAVENOUS | Status: DC | PRN
Start: 2016-03-23 — End: 2016-03-23
  Administered 2016-03-23: 150 mg via INTRAVENOUS
  Administered 2016-03-23 (×2): 50 mg via INTRAVENOUS

## 2016-03-23 MED ORDER — BUPIVACAINE-EPINEPHRINE (PF) 0.25% -1:200000 IJ SOLN
INTRAMUSCULAR | Status: AC
  Filled 2016-03-23: qty 30

## 2016-03-23 MED ORDER — CEFAZOLIN SODIUM-DEXTROSE 2-4 GM/100ML-% IV SOLN
INTRAVENOUS | Status: AC
  Filled 2016-03-23: qty 100

## 2016-03-23 MED ORDER — FENTANYL CITRATE (PF) 100 MCG/2ML IJ SOLN
INTRAMUSCULAR | Status: AC
Start: 2016-03-23 — End: 2016-03-23
  Filled 2016-03-23: qty 2

## 2016-03-23 MED ORDER — ONDANSETRON HCL 4 MG/2ML IJ SOLN
INTRAMUSCULAR | Status: DC | PRN
  Administered 2016-03-23: 4 mg via INTRAVENOUS

## 2016-03-23 MED ORDER — CEFAZOLIN SODIUM-DEXTROSE 2-4 GM/100ML-% IV SOLN
2.0000 g | INTRAVENOUS | Status: AC
  Administered 2016-03-23: 2 g via INTRAVENOUS

## 2016-03-23 MED ORDER — SUCCINYLCHOLINE CHLORIDE 20 MG/ML IJ SOLN
INTRAMUSCULAR | Status: DC | PRN
  Administered 2016-03-23: 100 mg via INTRAVENOUS

## 2016-03-23 MED ORDER — LIDOCAINE HCL (PF) 1 % IJ SOLN
INTRAMUSCULAR | Status: AC
  Filled 2016-03-23: qty 60

## 2016-03-23 MED ORDER — PROPOFOL 10 MG/ML IV BOLUS
INTRAVENOUS | Status: AC
  Filled 2016-03-23: qty 20

## 2016-03-23 MED ORDER — LACTATED RINGERS IV SOLN
INTRAVENOUS | Status: DC
  Administered 2016-03-23: 10 mL/h via INTRAVENOUS
  Administered 2016-03-23 (×2): via INTRAVENOUS

## 2016-03-23 MED ORDER — GLYCOPYRROLATE 0.2 MG/ML IJ SOLN: 0.2000 mg | Freq: Once | INTRAMUSCULAR | Status: DC | PRN

## 2016-03-23 MED ORDER — OXYCODONE HCL 5 MG/5ML PO SOLN: 5.0000 mg | Freq: Once | ORAL | Status: DC | PRN

## 2016-03-23 MED ORDER — BUPIVACAINE-EPINEPHRINE 0.25% -1:200000 IJ SOLN
INTRAMUSCULAR | Status: DC | PRN
  Administered 2016-03-23: 20 mL

## 2016-03-23 MED ORDER — MIDAZOLAM HCL 2 MG/2ML IJ SOLN
INTRAMUSCULAR | Status: AC
  Filled 2016-03-23: qty 2

## 2016-03-23 MED ORDER — SCOPOLAMINE 1 MG/3DAYS TD PT72: 1.0000 | MEDICATED_PATCH | Freq: Once | TRANSDERMAL | Status: DC | PRN

## 2016-03-23 MED ORDER — PHENYLEPHRINE HCL 10 MG/ML IJ SOLN
INTRAMUSCULAR | Status: DC | PRN
  Administered 2016-03-23: 80 ug via INTRAVENOUS

## 2016-03-23 MED ORDER — HYDROMORPHONE HCL 1 MG/ML IJ SOLN
INTRAMUSCULAR | Status: AC
  Filled 2016-03-23: qty 1

## 2016-03-23 MED ORDER — LIDOCAINE HCL 1 % IJ SOLN
INTRAVENOUS | Status: DC | PRN
  Administered 2016-03-23: 400 mL

## 2016-03-23 MED ORDER — DEXAMETHASONE SODIUM PHOSPHATE 10 MG/ML IJ SOLN
INTRAMUSCULAR | Status: AC
  Filled 2016-03-23: qty 2

## 2016-03-23 MED ORDER — LIDOCAINE 2% (20 MG/ML) 5 ML SYRINGE
INTRAMUSCULAR | Status: AC
  Filled 2016-03-23: qty 10

## 2016-03-23 MED ORDER — SODIUM CHLORIDE 0.9 % IR SOLN
Status: DC | PRN
  Administered 2016-03-23: 500 mL

## 2016-03-23 MED ORDER — EPHEDRINE SULFATE 50 MG/ML IJ SOLN
INTRAMUSCULAR | Status: DC | PRN
  Administered 2016-03-23: 10 mg via INTRAVENOUS

## 2016-03-23 SURGICAL SUPPLY — 54 items
BAG DECANTER FOR FLEXI CONT (MISCELLANEOUS) ×3 IMPLANT
BINDER BREAST XXLRG (GAUZE/BANDAGES/DRESSINGS) ×3 IMPLANT
BIOPATCH RED 1 DISK 7.0 (GAUZE/BANDAGES/DRESSINGS) ×4 IMPLANT
BIOPATCH RED 1IN DISK 7.0MM (GAUZE/BANDAGES/DRESSINGS) ×2
BLADE HEX COATED 2.75 (ELECTRODE) ×3 IMPLANT
BLADE KNIFE PERSONA 10 (BLADE) ×3 IMPLANT
BLADE SURG 15 STRL LF DISP TIS (BLADE) ×1 IMPLANT
BLADE SURG 15 STRL SS (BLADE) ×2
BNDG GAUZE ELAST 4 BULKY (GAUZE/BANDAGES/DRESSINGS) ×6 IMPLANT
CANISTER SUCT 1200ML W/VALVE (MISCELLANEOUS) ×3 IMPLANT
CHLORAPREP W/TINT 26ML (MISCELLANEOUS) ×3 IMPLANT
COVER BACK TABLE 60X90IN (DRAPES) ×3 IMPLANT
COVER MAYO STAND STRL (DRAPES) ×3 IMPLANT
DERMABOND ADVANCED (GAUZE/BANDAGES/DRESSINGS) ×4
DERMABOND ADVANCED .7 DNX12 (GAUZE/BANDAGES/DRESSINGS) ×2 IMPLANT
DRAIN CHANNEL 19F RND (DRAIN) ×6 IMPLANT
DRAPE LAPAROSCOPIC ABDOMINAL (DRAPES) ×3 IMPLANT
DRSG PAD ABDOMINAL 8X10 ST (GAUZE/BANDAGES/DRESSINGS) ×12 IMPLANT
ELECT REM PT RETURN 9FT ADLT (ELECTROSURGICAL) ×3
ELECTRODE REM PT RTRN 9FT ADLT (ELECTROSURGICAL) ×1 IMPLANT
EVACUATOR SILICONE 100CC (DRAIN) ×6 IMPLANT
FILTER LIPOSUCTION (MISCELLANEOUS) ×3 IMPLANT
GAUZE SPONGE 4X4 12PLY STRL (GAUZE/BANDAGES/DRESSINGS) ×3 IMPLANT
GLOVE BIO SURGEON STRL SZ 6.5 (GLOVE) ×10 IMPLANT
GLOVE BIO SURGEONS STRL SZ 6.5 (GLOVE) ×5
GLOVE SURG SS PI 7.0 STRL IVOR (GLOVE) ×9 IMPLANT
GOWN STRL REUS W/ TWL LRG LVL3 (GOWN DISPOSABLE) ×3 IMPLANT
GOWN STRL REUS W/TWL LRG LVL3 (GOWN DISPOSABLE) ×6
LINER CANISTER 1000CC FLEX (MISCELLANEOUS) ×3 IMPLANT
NDL SAFETY ECLIPSE 18X1.5 (NEEDLE) ×1 IMPLANT
NEEDLE HYPO 18GX1.5 SHARP (NEEDLE) ×2
NEEDLE HYPO 25X1 1.5 SAFETY (NEEDLE) ×3 IMPLANT
NS IRRIG 1000ML POUR BTL (IV SOLUTION) ×3 IMPLANT
PACK BASIN DAY SURGERY FS (CUSTOM PROCEDURE TRAY) ×3 IMPLANT
PENCIL BUTTON HOLSTER BLD 10FT (ELECTRODE) ×3 IMPLANT
SLEEVE SCD COMPRESS KNEE MED (MISCELLANEOUS) ×3 IMPLANT
SPONGE LAP 18X18 X RAY DECT (DISPOSABLE) ×6 IMPLANT
SUT MNCRL AB 4-0 PS2 18 (SUTURE) ×9 IMPLANT
SUT MON AB 3-0 SH 27 (SUTURE) ×4
SUT MON AB 3-0 SH27 (SUTURE) ×2 IMPLANT
SUT MON AB 5-0 PS2 18 (SUTURE) ×6 IMPLANT
SUT SILK 3 0 PS 1 (SUTURE) ×6 IMPLANT
SYR 50ML LL SCALE MARK (SYRINGE) ×3 IMPLANT
SYR BULB IRRIGATION 50ML (SYRINGE) ×3 IMPLANT
SYR CONTROL 10ML LL (SYRINGE) ×3 IMPLANT
SYR TB 1ML LL NO SAFETY (SYRINGE) ×3 IMPLANT
TAPE MEASURE VINYL STERILE (MISCELLANEOUS) ×3 IMPLANT
TOWEL OR 17X24 6PK STRL BLUE (TOWEL DISPOSABLE) ×9 IMPLANT
TUBE CONNECTING 20'X1/4 (TUBING) ×1
TUBE CONNECTING 20X1/4 (TUBING) ×2 IMPLANT
TUBING INFILTRATION IT-10001 (TUBING) ×3 IMPLANT
TUBING SET GRADUATE ASPIR 12FT (MISCELLANEOUS) ×3 IMPLANT
UNDERPAD 30X30 (UNDERPADS AND DIAPERS) ×6 IMPLANT
YANKAUER SUCT BULB TIP NO VENT (SUCTIONS) ×3 IMPLANT

## 2016-03-23 NOTE — H&P (View-Only) (Signed)
Lauren Farley is an 70 y.o. female.   Chief Complaint: history of breast cancer, breast asymmetry HPI: The patient is a 70 yrs old bf here for excision and liposuction of bilateral excess tissue at the lateral breast and axilla. She underwent bilateral mastectomies (12/2012) for a right breast cancer with axillary SLNB 12/2012 (T1N0). She did not have chemo or radiation. A core needle biopsy showed a right ductal carcinoma in situ with papillary features and a suspicion of possible invasive disease. The tumor was ER/PR positive with a proliferation marker Ki-67 6%. 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 She has arthritis, thyroid disease, and hot flashes. She underwent a bunionectomy, abdominal hysterectomy, knee surgery and bilateral mastectomies. She is not interested in implants or autologous reconstruction. She would like to have the excess skin removed so she can fit into the bra and prosthetics better. She is 5 feet 6 inches and weighs 225 pounds. She is mostly interested in the lateral area. We want to leave a little shelf at the inframammary fold so the bra will stay in place better. She was seen over a year ago but had a shoulder injury so her surgery was delayed. She has slightly bigger and more tissue on the left compared to the right. She is very unhappy with the deformed look as and the way she feels in clothes. There is some soreness of the skin due to the rubbing on the skin. She is in stable health at this time. She seems to have reasonable expectations.  History:   Past Medical History  Diagnosis Date  . Back pain   . Hot flashes   . Cancer ([facility])     Breast  . Thyroid disease     Graves disease s/p RAI treatment  . Hypothyroidism   . Insomnia   . Constipation   . History of blood transfusion     vein stripping in 70's  . Osteoporosis   . GERD (gastroesophageal reflux disease)     mygestic herbal  . Arthritis     lt knrr, bil feet,  bil hands, back    Past Surgical History  Procedure Laterality Date  . Bunionectomy Bilateral     spurs  . Abdominal hysterectomy      TAH/BSO  . Varicose vein surgery    . Knee arthroscopy      left  . Mastectomy modified radical Bilateral 12/24/2012    LYMPH NODE BIOPSY   . Simple mastectomy with axillary sentinel node biopsy Bilateral 12/24/2012    Procedure: BILATERAL MASTECTOMY;  Surgeon: Odis Hollingshead, MD;  Location: [facility];  Service: General;  Laterality: Bilateral;  . Axillary sentinel node biopsy Right 12/24/2012    Procedure: RIGHT AXILLARY SENTINEL LYMPH NODE BIOPSY;  Surgeon: Odis Hollingshead, MD;  Location: [facility];  Service: General;  Laterality: Right;  NUCLEAR MEDICINE INJECTION - RIGHT SIDE  . Breast surgery Bilateral   . Tubal ligation      Family History  Problem Relation Age of Onset  . Heart disease Sister   . Hypertension Sister   . Hypertension Brother   . Hypertension Sister    Social History:  reports that she has quit smoking. She has never used smokeless tobacco. She reports that she drinks alcohol. She reports that she does not use illicit drugs.  Allergies: No Known Allergies   (Not in a hospital admission)  No results found for this or any previous visit (from the past  48 hour(s)). No results found.  Review of Systems  Constitutional: Negative.   HENT: Negative.   Eyes: Negative.   Respiratory: Negative.   Cardiovascular: Negative.   Gastrointestinal: Negative.   Genitourinary: Negative.   Musculoskeletal: Negative.   Skin: Negative.   Neurological: Negative.   Psychiatric/Behavioral: Negative.     There were no vitals taken for this visit. Physical Exam  Constitutional: She is oriented to person, place, and time. She appears well-developed and well-nourished.  HENT:  Head: Normocephalic and atraumatic.  Eyes: Conjunctivae and EOM are normal. Pupils are equal, round, and reactive to light.  Cardiovascular: Normal rate.    Respiratory: Effort normal. No respiratory distress. She has no wheezes.  GI: Soft. She exhibits no distension. There is no tenderness.  Neurological: She is alert and oriented to person, place, and time.  Skin: Skin is warm.  Psychiatric: She has a normal mood and affect. Her behavior is normal. Judgment and thought content normal.     Assessment/Plan Plan excision of liposuction of bilateral lateral breast and axilla.  The risks that can be encountered with and after liposuction were discussed and include the following but no limited to these:  Asymmetry, fluid accumulation, firmness of the area, fat necrosis with death of fat tissue, bleeding, infection, delayed healing, anesthesia risks, skin sensation changes, injury to structures including nerves, blood vessels, and muscles which may be temporary or permanent, allergies to tape, suture materials and glues, blood products, topical preparations or injected agents, skin and contour irregularities, skin discoloration and swelling, deep vein thrombosis, cardiac and pulmonary complications, pain, which may persist, persistent pain, recurrence of the lesion, poor healing of the incision, possible need for revisional surgery or staged procedures. Thiere can also be persistent swelling, poor wound healing, rippling or loose skin, worsening of cellulite, swelling, and thermal burn or heat injury from ultrasound with the ultrasound-assisted lipoplasty technique. Any change in weight fluctuations can alter the outcome. The patient desires to proceed and consent was obtained.   Rabon Scholle S Roye Gustafson, DO 03/20/2016, 8:22 PM    

## 2016-03-23 NOTE — Transfer of Care (Signed)
Immediate Anesthesia Transfer of Care Note  Patient: Lauren Farley  Procedure(s) Performed: Procedure(s): EXCISION OF EXCESS LATERAL BREAST TISSUE AND   (Bilateral) LIPOSUCTION FOR SYMMETRY BILATERAL  (Bilateral)  Patient Location: PACU  Anesthesia Type:General  Level of Consciousness: awake, alert  and oriented  Airway & Oxygen Therapy: Patient Spontanous Breathing and Patient connected to face mask oxygen  Post-op Assessment: Report given to RN and Post -op Vital signs reviewed and stable  Post vital signs: Reviewed and stable  Last Vitals:  Filed Vitals:   03/23/16 1311  BP: 126/82  Pulse: 83  Temp: 36.8 C  Resp: 18    Last Pain: There were no vitals filed for this visit.       Complications: No apparent anesthesia complications

## 2016-03-23 NOTE — Anesthesia Postprocedure Evaluation (Signed)
Anesthesia Post Note  Patient: Minnetta Prothro  Procedure(s) Performed: Procedure(s) (LRB): EXCISION OF EXCESS LATERAL BREAST TISSUE AND   (Bilateral) LIPOSUCTION FOR SYMMETRY BILATERAL  (Bilateral)  Patient location during evaluation: PACU Anesthesia Type: General Level of consciousness: awake and alert and oriented Pain management: pain level controlled Vital Signs Assessment: post-procedure vital signs reviewed and stable Respiratory status: spontaneous breathing, nonlabored ventilation and respiratory function stable Cardiovascular status: blood pressure returned to baseline and stable Postop Assessment: no signs of nausea or vomiting Anesthetic complications: no    Last Vitals:  Filed Vitals:   03/23/16 1745 03/23/16 1800  BP: 141/91 140/85  Pulse: 75 78  Temp:    Resp: 15 10    Last Pain:  Filed Vitals:   03/23/16 1805  PainSc: 8                  Casimira Sutphin A.

## 2016-03-23 NOTE — Op Note (Signed)
Operative Note   DATE OF OPERATION: 03/23/2016  LOCATION: [facility]  SURGICAL DIVISION: Plastic Surgery  PREOPERATIVE DIAGNOSES:  History of breast cancer, acquired absence of bilateral breasts, breast asymmetry with excess axillary tissue  POSTOPERATIVE DIAGNOSES:  same  PROCEDURE:  Excision of excess breast / axillary tissue bilaterally for symmetry of breasts with liposuction (right 389 gm and left 477 gm)  SURGEON: Claire Sanger Dillingham, DO  ASSISTANT: Shawn Rayburn, PA  ANESTHESIA:  General.   COMPLICATIONS: None.   INDICATIONS FOR PROCEDURE:  The patient, Lauren Farley is a 70 y.o. female born on 21-Dec-1945, is here for treatment of breast asymmetry after bilateral mastectomies for breast cancer.  The excess tissue has been rubbing her arms and making if very difficult to fit into bras, clothes and wear a prosthetic. MRN: [id number]  CONSENT:  Informed consent was obtained directly from the patient. Risks, benefits and alternatives were fully discussed. Specific risks including but not limited to bleeding, infection, hematoma, seroma, scarring, pain, infection, contracture, asymmetry, wound healing problems, and need for further surgery were all discussed. The patient did have an ample opportunity to have questions answered to satisfaction.   DESCRIPTION OF PROCEDURE:  The patient was taken to the operating room. SCDs were placed and IV antibiotics were given. The patient's operative site was prepped and draped in a sterile fashion. A time out was performed and all information was confirmed to be correct.  General anesthesia was administered.  The lateral breast and axillary area were injected with tumescent.  The areas for excision were marked preoperatively.  Left: The Liposuction was then done to remove excess fat for better symmetry.  The #10 blade was then used to excise the excess skin.  Hemostasis was achieved with electrocautery.  The deep  layers were closed with 3-0 Monocyrl.  The drain was placed and secured to the skin with 4-0 Silk.  The subcutaneous layer and skin were then closed with 4-0 and 5-0 Monocryl.  Dermabond was placed over the incision.  ABDs and a breast binder were applied.  Right: The Liposuction was then done to remove excess fat for better symmetry.  The #10 blade was then used to excise the excess skin.  Hemostasis was achieved with electrocautery.  The deep layers were closed with 3-0 Monocyrl.  The drain was placed and secured to the skin with 4-0 Silk.  The subcutaneous layer and skin were then closed with 4-0 and 5-0 Monocryl.  Dermabond was placed over the incision.  ABDs and a breast binder were applied.  The patient tolerated the procedure well.  There were no complications. The patient was allowed to wake from anesthesia, extubated and taken to the recovery room in satisfactory condition.

## 2016-03-23 NOTE — Discharge Instructions (Signed)
May shower tomorrow No heavy lifting Continue binder or sports bra    Post Anesthesia Home Care Instructions  Activity: Get plenty of rest for the remainder of the day. A responsible adult should stay with you for 24 hours following the procedure.  For the next 24 hours, DO NOT: -Drive a car -Paediatric nurse -Drink alcoholic beverages -Take any medication unless instructed by your physician -Make any legal decisions or sign important papers.  Meals: Start with liquid foods such as gelatin or soup. Progress to regular foods as tolerated. Avoid greasy, spicy, heavy foods. If nausea and/or vomiting occur, drink only clear liquids until the nausea and/or vomiting subsides. Call your physician if vomiting continues.  Special Instructions/Symptoms: Your throat may feel dry or sore from the anesthesia or the breathing tube placed in your throat during surgery. If this causes discomfort, gargle with warm salt water. The discomfort should disappear within 24 hours.  If you had a scopolamine patch placed behind your ear for the management of post- operative nausea and/or vomiting:  1. The medication in the patch is effective for 72 hours, after which it should be removed.  Wrap patch in a tissue and discard in the trash. Wash hands thoroughly with soap and water. 2. You may remove the patch earlier than 72 hours if you experience unpleasant side effects which may include dry mouth, dizziness or visual disturbances. 3. Avoid touching the patch. Wash your hands with soap and water after contact with the patch.            JP Drain [employer] this sheet to all of your post-operative appointments while you have your drains.  Please measure your drains by CC's or ML's.  Make sure you drain and measure your JP Drains 2 or 3 times per day.  At the end of each day, add up totals for the left side and add up totals for the right side.    ( 9 am )     ( 3 pm )        ( 9 pm )                 Date L  R  L  R  L  R  Total L/R                                                                                                                                                                                       About my Jackson-Pratt Bulb Drain  What is a Jackson-Pratt bulb? A Jackson-Pratt is a soft, round device used to collect drainage. It is connected to a long, thin  drainage catheter, which is held in place by one or two small stiches near your surgical incision site. When the bulb is squeezed, it forms a vacuum, forcing the drainage to empty into the bulb.  Emptying the Jackson-Pratt bulb- To empty the bulb: 1. Release the plug on the top of the bulb. 2. Pour the bulb's contents into a measuring container which your nurse will provide. 3. Record the time emptied and amount of drainage. Empty the drain(s) as often as your     doctor or nurse recommends.  Date                  Time                    Amount (Drain 1)                 Amount (Drain 2)  _____________________________________________________________________  _____________________________________________________________________  _____________________________________________________________________  _____________________________________________________________________  _____________________________________________________________________  _____________________________________________________________________  _____________________________________________________________________  _____________________________________________________________________  Squeezing the Jackson-Pratt Bulb- To squeeze the bulb: 1. Make sure the plug at the top of the bulb is open. 2. Squeeze the bulb tightly in your fist. You will hear air squeezing from the bulb. 3. Replace the plug while the bulb is squeezed. 4. Use a safety pin to attach the bulb to your clothing. This will keep the catheter from     pulling at the  bulb insertion site.  When to call your doctor- Call your doctor if:  Drain site becomes red, swollen or hot.  You have a fever greater than 101 degrees F.  There is oozing at the drain site.  Drain falls out (apply a guaze bandage over the drain hole and secure it with tape).  Drainage increases daily not related to activity patterns. (You will usually have more drainage when you are active than when you are resting.)  Drainage has a bad odor.

## 2016-03-23 NOTE — Anesthesia Procedure Notes (Signed)
Procedure Name: Intubation Date/Time: 03/23/2016 3:14 PM Performed by: Maryella Shivers Pre-anesthesia Checklist: Patient identified, Emergency Drugs available, Suction available and Patient being monitored Patient Re-evaluated:Patient Re-evaluated prior to inductionOxygen Delivery Method: Circle system utilized Preoxygenation: Pre-oxygenation with 100% oxygen Intubation Type: IV induction Ventilation: Mask ventilation without difficulty Laryngoscope Size: Mac and 3 Grade View: Grade II Tube type: Oral Tube size: 7.0 mm Number of attempts: 2 Airway Equipment and Method: Stylet and Oral airway Placement Confirmation: ETT inserted through vocal cords under direct vision,  positive ETCO2 and breath sounds checked- equal and bilateral Secured at: 20 cm Tube secured with: Tape Dental Injury: Teeth and Oropharynx as per pre-operative assessment

## 2016-03-23 NOTE — Brief Op Note (Signed)
03/23/2016  3:11 PM  PATIENT:  Lauren Farley  70 y.o. female  PRE-OPERATIVE DIAGNOSIS:  BREAST HYPERTROPHY BILATERAL   POST-OPERATIVE DIAGNOSIS:  BREAST HYPERTROPHY BILATERAL   PROCEDURE:  Procedure(s): EXCISION OF EXCESS LATERAL BREAST TISSUE AND   (Bilateral) LIPOSUCTION FOR SYMMETRY BILATERAL  (Bilateral)  SURGEON:  Surgeon(s) and Role:    * Zakayla Martinec S Tyria Springer, DO - Primary  PHYSICIAN ASSISTANT: Shawn Rayburn, PA  ASSISTANTS: none   ANESTHESIA:   general  EBL:     BLOOD ADMINISTERED:none  DRAINS: (1) Jackson-Pratt drain(s) with closed bulb suction in the breast pocket on each side   LOCAL MEDICATIONS USED:  MARCAINE    and LIDOCAINE   SPECIMEN:  Source of Specimen:  bilateral axillary breast tissue  DISPOSITION OF SPECIMEN:  PATHOLOGY  COUNTS:  YES  TOURNIQUET:  * No tourniquets in log *  DICTATION: .Dragon Dictation  PLAN OF CARE: Discharge to home after PACU  PATIENT DISPOSITION:  PACU - hemodynamically stable.   Delay start of Pharmacological VTE agent (>24hrs) due to surgical blood loss or risk of bleeding: no

## 2016-03-23 NOTE — Interval H&P Note (Signed)
History and Physical Interval Note:  03/23/2016 12:28 PM  Lauren Farley  has presented today for surgery, with the diagnosis of BREAST HYPERTROPHY BILATERAL   The various methods of treatment have been discussed with the patient and family. After consideration of risks, benefits and other options for treatment, the patient has consented to  Procedure(s): EXCISION OF EXCESS LATERAL BREAST TISSUE AND   (Bilateral) LIPOSUCTION FOR SYMMETRY BILATERAL  (Bilateral) as a surgical intervention .  The patient's history has been reviewed, patient examined, no change in status, stable for surgery.  I have reviewed the patient's chart and labs.  Questions were answered to the patient's satisfaction.     Wallace Going

## 2016-03-23 NOTE — Anesthesia Preprocedure Evaluation (Signed)
Anesthesia Evaluation  Patient identified by MRN, date of birth, ID band Patient awake    Reviewed: Allergy & Precautions, NPO status , Patient's Chart, lab work & pertinent test results  Airway Mallampati: II   Neck ROM: full    Dental   Pulmonary former smoker,    breath sounds clear to auscultation       Cardiovascular negative cardio ROS   Rhythm:regular Rate:Normal     Neuro/Psych    GI/Hepatic GERD  ,  Endo/Other  Hypothyroidism obese  Renal/GU      Musculoskeletal  (+) Arthritis ,   Abdominal   Peds  Hematology   Anesthesia Other Findings   Reproductive/Obstetrics                             Anesthesia Physical Anesthesia Plan  ASA: II  Anesthesia Plan: General   Post-op Pain Management:    Induction: Intravenous  Airway Management Planned: Oral ETT  Additional Equipment:   Intra-op Plan:   Post-operative Plan: Extubation in OR  Informed Consent: I have reviewed the patients History and Physical, chart, labs and discussed the procedure including the risks, benefits and alternatives for the proposed anesthesia with the patient or authorized representative who has indicated his/her understanding and acceptance.     Plan Discussed with: CRNA, Anesthesiologist and Surgeon  Anesthesia Plan Comments:         Anesthesia Quick Evaluation

## 2016-03-24 ENCOUNTER — Encounter (HOSPITAL_BASED_OUTPATIENT_CLINIC_OR_DEPARTMENT_OTHER): Payer: Self-pay | Admitting: Plastic Surgery

## 2016-05-12 ENCOUNTER — Encounter: Payer: Self-pay | Admitting: Vascular Surgery

## 2016-05-16 ENCOUNTER — Ambulatory Visit (INDEPENDENT_AMBULATORY_CARE_PROVIDER_SITE_OTHER): Payer: Medicare HMO | Admitting: Vascular Surgery

## 2016-05-16 ENCOUNTER — Encounter: Payer: Self-pay | Admitting: Vascular Surgery

## 2016-05-16 VITALS — BP 134/84 | HR 91 | Resp 16 | Ht 66.0 in | Wt 221.0 lb

## 2016-05-16 DIAGNOSIS — I83893 Varicose veins of bilateral lower extremities with other complications: Secondary | ICD-10-CM

## 2016-05-16 DIAGNOSIS — I83891 Varicose veins of right lower extremities with other complications: Secondary | ICD-10-CM | POA: Insufficient documentation

## 2016-05-16 NOTE — Progress Notes (Signed)
Subjective:     Patient ID: Lauren Farley, female   DOB: [birth year], 70 y.o.   MRN: [id number]  HPI this 70 year old female is evaluated for painful varicosities inThe right leg. Patient was referred by Daphene Calamity. Patient has a remote history of vein stripping of the right leg about order years ago. 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 She develops aching throbbing and burning discomfort as the day progresses. She does were short leg elastic compression stockings which does help some. She has no history of DVT thrombophlebitis stasis ulcers or bleeding. Symptoms continued to worsen and the edema she develops in the right ankle area worsens as well.  Past Medical History:  Diagnosis Date  . Arthritis    lt knrr, bil feet, bil hands, back  . Back pain   . Cancer ([facility])    Breast  . Constipation   . GERD (gastroesophageal reflux disease)    mygestic herbal  . History of blood transfusion    vein stripping in 70's  . Hot flashes   . Hypothyroidism   . Insomnia   . Osteoporosis   . Thyroid disease    Graves disease s/p RAI treatment  . Varicose veins     Social History  Substance Use Topics  . Smoking status: Former Smoker    Years: 8.00  . Smokeless tobacco: Never Used     Comment: quit in th '80's  . Alcohol use Yes     Comment: 1-2 per week    Family History  Problem Relation Age of Onset  . Heart disease Sister   . Hypertension Sister   . Hypertension Brother   . Hypertension Sister     No Known Allergies   Current Outpatient Prescriptions:  .  B Complex-C (B-COMPLEX WITH VITAMIN C) tablet, Take 1 tablet by mouth daily., Disp: , Rfl:  .  Cholecalciferol (VITAMIN D3) 2000 UNITS TABS, Take 1 tablet by mouth daily., Disp: , Rfl:  .  Echinacea 500 MG CAPS, Take by mouth., Disp: , Rfl:  .  Garlic 10 MG CAPS, Take by mouth. 4 po qd, Disp: , Rfl:  .  Ginger, Zingiber officinalis, (GINGER  ROOT PO), Take by mouth., Disp: , Rfl:  .  levothyroxine (SYNTHROID, LEVOTHROID) 112 MCG tablet, Take 1 tablet (112 mcg total) by mouth daily before breakfast., Disp: 30 tablet, Rfl: 2 .  Multiple Vitamins-Minerals (MULTIVITAMIN WITH MINERALS) tablet, Take 1 tablet by mouth daily., Disp: , Rfl:   Vitals:   05/16/16 1021  BP: 134/84  Pulse: 91  Resp: 16  SpO2: 98%  Weight: 221 lb (100.2 kg)  Height: 5\' 6"  (1.676 m)    Body mass index is 35.67 kg/m.          Review of Systems denies chest pain, dyspnea on exertion, PND, orthopnea. Has recent mastectomy for breast cancer and breast reconstruction. Patient has bad osteoarthritis and left knee and also has degenerative disc disease and spine. Other systems negative and complete review of systems     Objective:   Physical Exam BP 134/84 (BP Location: Left Arm, Patient Position: Sitting, Cuff Size: Normal)   Pulse 91   Resp 16   Ht 5\' 6"  (1.676 m)   Wt 221 lb (100.2 kg)   SpO2 98%   BMI 35.67 kg/m     Gen.-alert and oriented x3 in no apparent distress HEENT normal for age Lungs no rhonchi or wheezing Cardiovascular  regular rhythm no murmurs carotid pulses 3+ palpable no bruits audible Abdomen soft nontender no palpable masses Musculoskeletal free of  major deformities Skin clear -no rashes Neurologic normal Lower extremities 3+ femoral and dorsalis pedis pulses palpable bilaterally with no edema on the left 1+ on the right below the knee. Bulging varicosities right leg and distal thigh medially and medial calf extending the medial malleolus. Early hyperpigmentation. No active ulcer noted.  Today I performed a bedside ultrasound sono site exam which reveals that the patient does have significant great saphenous vein remaining in the right leg which is large caliber in the mid and distal thigh up to the proximal thigh. This has gross rapid reflux.       Assessment:     #1 painful varicosities and swelling right leg.  Status post remote history of right great saphenous vein stripping which was incomplete as patient has evidence of residual great saphenous vein with gross reflux #2 history of breast cancer with reconstruction #3 osteoarthritis left knee #4 degenerative disc disease    Plan:         #1 long leg elastic compression stockings 20-30 mm gradient #2 elevate legs as much as possible #3 ibuprofen daily on a regular basis for pain #4 return in 3 months-she will get formal venous reflux study of the right leg in the interim and recommendations will be made in 3 months Patient will likely need laser ablation right great saphenous vein followed by three-month waiting period and multiple stab phlebectomy of painful varicosities Return in 3 months

## 2016-06-02 NOTE — Addendum Note (Signed)
Addended by: Thresa Ross C on: 06/02/2016 11:00 AM   Modules accepted: Orders

## 2016-08-18 ENCOUNTER — Encounter: Payer: Self-pay | Admitting: Vascular Surgery

## 2016-08-22 ENCOUNTER — Encounter: Payer: Self-pay | Admitting: Vascular Surgery

## 2016-08-22 ENCOUNTER — Ambulatory Visit (HOSPITAL_COMMUNITY)
Admission: RE | Admit: 2016-08-22 | Discharge: 2016-08-22 | Disposition: A | Discharge status: Home / Self Care | Payer: Medicare HMO | Source: Ambulatory Visit | Attending: Vascular Surgery | Admitting: Vascular Surgery

## 2016-08-22 ENCOUNTER — Ambulatory Visit (INDEPENDENT_AMBULATORY_CARE_PROVIDER_SITE_OTHER): Payer: Medicare HMO | Admitting: Vascular Surgery

## 2016-08-22 VITALS — BP 145/86 | HR 73 | Temp 97.1°F | Resp 20 | Ht 66.0 in | Wt 228.9 lb

## 2016-08-22 DIAGNOSIS — I83891 Varicose veins of right lower extremities with other complications: Secondary | ICD-10-CM

## 2016-08-22 DIAGNOSIS — R609 Edema, unspecified: Secondary | ICD-10-CM | POA: Diagnosis present

## 2016-08-22 DIAGNOSIS — I83893 Varicose veins of bilateral lower extremities with other complications: Secondary | ICD-10-CM | POA: Diagnosis not present

## 2016-08-22 NOTE — Progress Notes (Signed)
Subjective:     Patient ID: Lauren Farley, female   DOB: [birth year], 70 y.o.   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 Patient had remote history of vein stripping performed in the [place] in the 1970s. Apparently on the very proximal portion of her great saphenous vein was removed. She has developed severe painful varicosities in the right medial thigh and calf over the past several years which have become increasingly uncomfortable and she has developed chronic progressive swelling in the right ankle. She is tried long-leg elastic compression stockings 20-30 millimeter gradient as well as elevation and ibuprofen with no success. These are affecting her daily living she would like treatment.  Past Medical History:  Diagnosis Date  . Arthritis    lt knrr, bil feet, bil hands, back  . Back pain   . Cancer (HCC)    Breast  . Constipation   . GERD (gastroesophageal reflux disease)    mygestic herbal  . History of blood transfusion    vein stripping in 70's  . Hot flashes   . Hypothyroidism   . Insomnia   . Osteoporosis   . Thyroid disease    Graves disease s/p RAI treatment  . Varicose veins     Social History  Substance Use Topics  . Smoking status: Former Smoker    Years: 8.00  . Smokeless tobacco: Never Used     Comment: quit in th '80's  . Alcohol use Yes     Comment: 1-2 per week    Family History  Problem Relation Age of Onset  . Heart disease Sister   . Hypertension Sister   . Hypertension Brother   . Hypertension Sister     No Known Allergies   Current Outpatient Prescriptions:  .  B Complex-C (B-COMPLEX WITH VITAMIN C) tablet, Take 1 tablet by mouth daily., Disp: , Rfl:  .  Cholecalciferol (VITAMIN D3) 2000 UNITS TABS, Take 1 tablet by mouth daily., Disp: , Rfl:  .  Echinacea 500 MG CAPS, Take by mouth., Disp: , Rfl:  .  Garlic 10 MG CAPS,  Take by mouth. 4 po qd, Disp: , Rfl:  .  Ginger, Zingiber officinalis, (GINGER ROOT PO), Take by mouth., Disp: , Rfl:  .  levothyroxine (SYNTHROID, LEVOTHROID) 112 MCG tablet, Take 1 tablet (112 mcg total) by mouth daily before breakfast., Disp: 30 tablet, Rfl: 2 .  Multiple Vitamins-Minerals (MULTIVITAMIN WITH MINERALS) tablet, Take 1 tablet by mouth daily., Disp: , Rfl:   Vitals:   08/22/16 1038 08/22/16 1041  BP: (!) 146/88 (!) 145/86  Pulse: 73   Resp: 20   Temp: 97.1 F (36.2 C)   TempSrc: Oral   SpO2: 96%   Weight: 228 lb 14.4 oz (103.8 kg)   Height: 5\' 6"  (1.676 m)     Body mass index is 36.95 kg/m.         Review of Systems Denies chest pain but does have dyspnea on exertion. No orthopnea or hemoptysis. Denies claudication. Does have history of bone spurs in both feet.    Objective:   Physical Exam BP (!) 145/86 (BP Location: Left Arm)   Pulse 73   Temp 97.1 F (36.2 C) (Oral)   Resp 20   Ht 5\' 6"  (1.676 m)   Wt 228 lb 14.4 oz (103.8 kg)   SpO2 96%   BMI 36.95 kg/m   Gen. well-developed well-nourished  female no apparent distress alert and oriented 3 Lungs no rhonchi or wheezing Right leg with extensive bulging varicosities in the medial thigh and calf area with 1+ edema distally. No hyperpigmentation rectum ulcerations noted. 2+ dorsalis pedis pulse palpable.  Patient had a formal venous duplex exam which revealed reflux in the right great saphenous vein in the mid thigh the large caliber vein. I performed a bedside SonoSite ultrasound exam today and there is rapid reflux in the right great saphenous vein from the knee up to the proximal thigh.     Assessment:     Gross reflux right great saphenous system causing painful varicosities which are resistant to conservative measures including long-leg elastic compression stockings 20-30 millimeter gradient, elevation, and ibuprofen. Symptoms are affecting patient's daily living    Plan:     Patient needs #1  laser ablation right great saphenous vein followed by three-month waiting period and then to be evaluated for possible stab phlebectomy of residual painful varicosities We will proceed with precertification to perform this in the near future

## 2016-08-28 ENCOUNTER — Other Ambulatory Visit: Payer: Self-pay | Admitting: *Deleted

## 2016-08-28 DIAGNOSIS — I83811 Varicose veins of right lower extremities with pain: Secondary | ICD-10-CM

## 2016-09-26 ENCOUNTER — Other Ambulatory Visit: Payer: Medicare HMO | Admitting: Vascular Surgery

## 2016-10-02 ENCOUNTER — Ambulatory Visit: Payer: Medicare HMO | Admitting: Vascular Surgery

## 2016-10-02 ENCOUNTER — Encounter (HOSPITAL_COMMUNITY): Payer: Medicare HMO

## 2016-11-07 ENCOUNTER — Other Ambulatory Visit: Payer: Medicare HMO | Admitting: Vascular Surgery

## 2016-11-14 ENCOUNTER — Ambulatory Visit: Payer: Medicare HMO | Admitting: Vascular Surgery

## 2016-11-14 ENCOUNTER — Encounter (HOSPITAL_COMMUNITY): Payer: Medicare HMO

## 2017-01-24 ENCOUNTER — Encounter: Payer: Self-pay | Admitting: Podiatry

## 2017-01-24 ENCOUNTER — Ambulatory Visit (INDEPENDENT_AMBULATORY_CARE_PROVIDER_SITE_OTHER): Payer: Medicare HMO | Admitting: Podiatry

## 2017-01-24 VITALS — BP 136/81 | HR 88 | Ht 66.0 in | Wt 220.0 lb

## 2017-01-24 DIAGNOSIS — M79672 Pain in left foot: Secondary | ICD-10-CM

## 2017-01-24 DIAGNOSIS — M79671 Pain in right foot: Secondary | ICD-10-CM

## 2017-01-24 DIAGNOSIS — M898X9 Other specified disorders of bone, unspecified site: Secondary | ICD-10-CM | POA: Diagnosis not present

## 2017-01-24 DIAGNOSIS — M21969 Unspecified acquired deformity of unspecified lower leg: Secondary | ICD-10-CM | POA: Diagnosis not present

## 2017-01-24 NOTE — Patient Instructions (Signed)
Seen for painful feet. Noted of excess bone formation over the old surgical site. All options reviewed. Return for pre op.

## 2017-01-24 NOTE — Progress Notes (Signed)
SUBJECTIVE: 71 y.o. year old female presents complaining of bilateral foot pain L>R. Left foot hurts on top with raised bump. It was operated on to reduce the bump 4 years ago but the bump returned in 3 months. It is very painful to walk.  REVIEW OF SYSTEMS: A comprehensive review of systems was negative except for: Right breast cancer survivor x 4 years. Hypothyroid  OBJECTIVE: DERMATOLOGIC EXAMINATION: Multiple old incision scar over dorsum mid foot bilateral and over the first MPJ right foot.  VASCULAR EXAMINATION OF LOWER LIMBS: All pedal pulses are palpable with normal pulsation.  Capillary Filling times within 3 seconds in all digits.  Temperature gradient from tibial crest to dorsum of foot is within normal bilateral.  NEUROLOGIC EXAMINATION OF THE LOWER LIMBS: Achilles DTR is present and within normal. Monofilament (Semmes-Weinstein 10-gm) sensory testing positive 6 out of 6, bilateral. Vibratory sensations(128Hz  turning fork) intact at medial and lateral forefoot bilateral.  Sharp and Dull discriminatory sensations at the plantar ball of hallux is intact bilateral.   MUSCULOSKELETAL EXAMINATION: Positive for elevated first metatarsal left, firm hard raised mid foot left with pain, raised knot on right center Lisfranc joint. Tight Achilles tendon bilateral.  RADIOGRAPHIC FINDINGS: AP View:  Long first metatarsal, increased lateral deviation angle of CCJ left. Mildly increased lateral deviation angle.  Fused first MPJ with bone mass in joint space with irrgular margins.  Lateral view:  Bony overgrowth over the Lisfranc joint left foot. Elevated first metatarsal with plantar and posterior calcaneal spur.  ASSESSMENT: Pain in both feet L>R. Dorsal exostosis left, post surgical since 2015. Elevated first metatarsal left. Ankle equinus bilateral.  PLAN: Reviewed clinical findings and all available treatment options, injection, compression, change shoe gear,  surgical.

## 2017-03-05 ENCOUNTER — Ambulatory Visit (INDEPENDENT_AMBULATORY_CARE_PROVIDER_SITE_OTHER): Payer: Medicare HMO | Admitting: Podiatry

## 2017-03-05 DIAGNOSIS — M898X7 Other specified disorders of bone, ankle and foot: Secondary | ICD-10-CM | POA: Diagnosis not present

## 2017-03-05 DIAGNOSIS — M21962 Unspecified acquired deformity of left lower leg: Secondary | ICD-10-CM

## 2017-03-05 DIAGNOSIS — M79671 Pain in right foot: Secondary | ICD-10-CM | POA: Diagnosis not present

## 2017-03-05 DIAGNOSIS — M79672 Pain in left foot: Secondary | ICD-10-CM | POA: Diagnosis not present

## 2017-03-05 DIAGNOSIS — M898X9 Other specified disorders of bone, unspecified site: Secondary | ICD-10-CM

## 2017-03-05 DIAGNOSIS — M21969 Unspecified acquired deformity of unspecified lower leg: Secondary | ICD-10-CM

## 2017-03-05 NOTE — Patient Instructions (Signed)
Pain in left foot due to excess bone formation at the old surgery site. Surgery consent form reviewed for removal of excess bone from midfoot top left foot.

## 2017-03-05 NOTE — Progress Notes (Signed)
SUBJECTIVE: 71 y.o. year old female returns complaining of left foot pain and requesting surgical intervention. Left foot has a raised bump that is hurting all the time with any kind of tie up shoes.  The area has been operated on to reduce the bump 4 years ago, 2015 but the bump returned in 3 months. It is very painful to walk.  REVIEW OF SYSTEMS: A comprehensive review of systems was negative except for: Right breast cancer survivor x 4 years. Hypothyroid  OBJECTIVE: DERMATOLOGIC EXAMINATION: Multiple old incision scar over dorsum mid foot bilateral and over the first MPJ right foot.  VASCULAR EXAMINATION OF LOWER LIMBS: All pedal pulses are palpable with normal pulsation.  Capillary Filling times within 3 seconds in all digits.  Temperature gradient from tibial crest to dorsum of foot is within normal bilateral.  NEUROLOGIC EXAMINATION OF THE LOWER LIMBS: Achilles DTR is present and within normal. Monofilament (Semmes-Weinstein 10-gm) sensory testing positive 6 out of 6, bilateral. Vibratory sensations(128Hz  turning fork) intact at medial and lateral forefoot bilateral.  Sharp and Dull discriminatory sensations at the plantar ball of hallux is intact bilateral.   MUSCULOSKELETAL EXAMINATION: Positive for elevated first metatarsal left, firm hard raised mid foot left with pain, raised knot on right center Lisfranc joint. Tight Achilles tendon bilateral.  Her previous X-ray findlings include AP View:  Long first metatarsal, increased lateral deviation angle of CCJ left. Mildly increased lateral deviation angle.  Fused first MPJ with bone mass in joint space with irrgular margins.  Lateral view:  Bony overgrowth over the Lisfranc joint left foot. Elevated first metatarsal with plantar and posterior calcaneal spur.  ASSESSMENT: Pain in both feet L>R. Dorsal exostosis left, post surgical since 2015. Elevated first metatarsal left. Ankle equinus  bilateral.  PLAN: Reviewed clinical findings and all available treatment options, injection, compression, change shoe gear, surgical. As per request surgery consent form reviewed for removal of abnormal bone growth left foot dorsum.

## 2017-03-06 ENCOUNTER — Encounter: Payer: Self-pay | Admitting: Podiatry

## 2017-03-22 ENCOUNTER — Other Ambulatory Visit: Payer: Self-pay | Admitting: Podiatry

## 2017-03-22 MED ORDER — HYDROCODONE-ACETAMINOPHEN 10-325 MG PO TABS: 1.0000 | ORAL_TABLET | Freq: Four times a day (QID) | ORAL | 0 refills | Status: DC | PRN

## 2017-03-23 DIAGNOSIS — M25775 Osteophyte, left foot: Secondary | ICD-10-CM

## 2017-03-23 HISTORY — PX: OTHER SURGICAL HISTORY: SHX169

## 2017-03-29 ENCOUNTER — Encounter: Payer: Self-pay | Admitting: Podiatry

## 2017-03-29 ENCOUNTER — Ambulatory Visit (INDEPENDENT_AMBULATORY_CARE_PROVIDER_SITE_OTHER): Payer: Medicare HMO | Admitting: Podiatry

## 2017-03-29 DIAGNOSIS — Z9889 Other specified postprocedural states: Secondary | ICD-10-CM

## 2017-03-29 MED ORDER — HYDROCODONE-ACETAMINOPHEN 10-325 MG PO TABS: 1.0000 | ORAL_TABLET | Freq: Four times a day (QID) | ORAL | 0 refills | Status: AC | PRN

## 2017-03-29 NOTE — Progress Notes (Signed)
S/P 6 days arsal exostectomy left foot 03/23/17. Stated that her foot feels fine. Denies any fever. Taking pain pill one every 6 hours.  Clean dry wound and healing well. Dressing changed. Return in one week.

## 2017-03-29 NOTE — Patient Instructions (Signed)
1 week post op wound left foot following resection of mid foot bone spur. Dressing changed. Ok to wear any shoes that fits. Return in one week.

## 2017-04-05 ENCOUNTER — Encounter: Payer: Self-pay | Admitting: Podiatry

## 2017-04-05 ENCOUNTER — Ambulatory Visit (INDEPENDENT_AMBULATORY_CARE_PROVIDER_SITE_OTHER): Payer: Medicare HMO | Admitting: Podiatry

## 2017-04-05 DIAGNOSIS — Z9889 Other specified postprocedural states: Secondary | ICD-10-CM

## 2017-04-05 NOTE — Patient Instructions (Signed)
2 weeks post op left foot doing well. Suture removed.Mefix tape placed with home care instruction. Keep the area compressed with elastic compression stockinet while up and around. May remove all outer wrap except tape on skin while resting or in bed. Return in 2 weeks.

## 2017-04-05 NOTE — Progress Notes (Signed)
S/P 13 days arsal exostectomy left foot 03/23/17. Stated that her foot feels fine.  Clean dry wound and healing well. Suture removed. Compression bandage applied with home care instruction. Return in 2 week.

## 2017-04-19 ENCOUNTER — Ambulatory Visit (INDEPENDENT_AMBULATORY_CARE_PROVIDER_SITE_OTHER): Payer: Medicare HMO | Admitting: Podiatry

## 2017-04-19 ENCOUNTER — Encounter: Payer: Self-pay | Admitting: Podiatry

## 2017-04-19 DIAGNOSIS — Z9889 Other specified postprocedural states: Secondary | ICD-10-CM

## 2017-04-19 NOTE — Progress Notes (Signed)
S/P 1 month post op exostectomy left foot 03/23/17. Stated that her foot feels fine.  Clean dry wound and healing well. Compression bandage applied with home care instruction. Return in 2 week.

## 2017-04-19 NOTE — Patient Instructions (Signed)
1 month post op wound healing normal. Home care instruction discussed and dispensed supply. Return as needed.

## 2017-05-09 ENCOUNTER — Encounter: Payer: Self-pay | Admitting: Cardiology

## 2017-05-09 ENCOUNTER — Ambulatory Visit (INDEPENDENT_AMBULATORY_CARE_PROVIDER_SITE_OTHER): Payer: Medicare HMO | Admitting: Cardiology

## 2017-05-09 VITALS — BP 114/70 | HR 93 | Ht 66.0 in | Wt 223.1 lb

## 2017-05-09 DIAGNOSIS — E018 Other iodine-deficiency related thyroid disorders and allied conditions: Secondary | ICD-10-CM | POA: Diagnosis not present

## 2017-05-09 DIAGNOSIS — I209 Angina pectoris, unspecified: Secondary | ICD-10-CM

## 2017-05-09 MED ORDER — NITROGLYCERIN 0.4 MG SL SUBL: 0.4000 mg | SUBLINGUAL_TABLET | SUBLINGUAL | 11 refills | Status: DC | PRN

## 2017-05-09 MED ORDER — ASPIRIN EC 81 MG PO TBEC: 81.0000 mg | DELAYED_RELEASE_TABLET | Freq: Every day | ORAL | 0 refills | Status: DC

## 2017-05-09 NOTE — Patient Instructions (Signed)
Medication Instructions:  Your physician has recommended you make the following change in your medication:  START aspirin 81 mg enteric coated daily. This is over the counter. START nitroglycerin 0.4 mg sublingual. When having chest pain, stop what you are doing and sit down. Take 1 nitro, wait 5 minutes. Still having chest pain, take 1 nitro, wait 5 minutes. Still having chest pain, take 1 nitro, dial 911. Total of 3 nitro in 15 minutes.  Labwork: None  Testing/Procedures: You had an EKG today.  Follow-Up: Your physician recommends that you schedule a follow-up appointment in: 1 month.   Any Other Special Instructions Will Be Listed Below (If Applicable).     If you need a refill on your cardiac medications before your next appointment, please call your pharmacy.

## 2017-05-09 NOTE — Progress Notes (Signed)
Cardiology Office Note:    Date:  05/09/2017   ID:  Lauren Farley, DOB 09-Jun-1946, MRN [id number]  PCP:  Daphene Calamity, MD  Cardiologist:  Jenean Lindau, MD   Referring MD: Jackolyn Confer, MD    ASSESSMENT:    1. HYPOTHYROIDISM, POST-RADIOACTIVE IODINE   2. Angina pectoris ([place])    PLAN:    In order of problems listed above:  1. I discussed my findings with the patient at extensive length. Her symptoms are very concerning.I discussed coronary angiography and left heart catheterization with the patient at extensive length. Procedure, benefits and potential risks were explained. Patient had multiple questions which were answered to the patient's satisfaction. Patient understood and wants to think about this and let me know whether she is ready  for the procedure. In the interim,if the patient has any significant symptoms he knows to go to the nearest emergency room. 2. I informed her that her condition and her symptoms are very concerning and that she should be electively no as quickly as possible. She is not even keen on making her mind about stress testing at this point. I advised her to take coated enteric aspirin 81 mg daily and also sublingual nitroglycerin prescription was sent and this protocol and 911 protocol explained and she vocalized understanding. I wanted to give her a small dose of a beta blocker but she is not keen on it she really takes only "herbal medicines".   Medication Adjustments/Labs and Tests Ordered: Current medicines are reviewed at length with the patient today.  Concerns regarding medicines are outlined above.  Orders Placed This Encounter  Procedures  . EKG 12-Lead   Meds ordered this encounter  Medications  . aspirin EC 81 MG tablet    Sig: Take 1 tablet (81 mg total) by mouth daily.    Dispense:  30 tablet    Refill:  0  . nitroGLYCERIN (NITROSTAT) 0.4 MG SL tablet    Sig: Place 1 tablet (0.4 mg total) under the tongue every 5 (five)  minutes as needed for chest pain.    Dispense:  25 tablet    Refill:  11     History of Present Illness:    Lauren Farley is a 71 y.o. female who is being seen today for the evaluation of Chest pain at the request of Jackolyn Confer, MD. Patient is a pleasant 71 year old female. She has past medical history of bilateral breast cancer. She's undergone mastectomy about 4 years ago. She also has history of hypothyroidism on replacement doses of thyroid. She mentions to me that over the past several weeks she is noticing substernal chest tightness going to the neck and to the left. No orthopnea or PND. At the time of my evaluation she is alert awake oriented and in no distress. These symptoms are brought out when she has any kind of stress.  Past Medical History:  Diagnosis Date  . Arthritis    lt knrr, bil feet, bil hands, back  . Back pain   . Cancer (HCC)    Breast  . Constipation   . GERD (gastroesophageal reflux disease)    mygestic herbal  . History of blood transfusion    vein stripping in 70's  . Hot flashes   . Hypothyroidism   . Insomnia   . Osteoporosis   . Thyroid disease    Graves disease s/p RAI treatment  . Varicose veins     Past Surgical History:  Procedure Laterality Date  . ABDOMINAL  HYSTERECTOMY     TAH/BSO  . AXILLARY SENTINEL NODE BIOPSY Right 12/24/2012   Procedure: RIGHT AXILLARY SENTINEL LYMPH NODE BIOPSY;  Surgeon: Odis Hollingshead, MD;  Location: [facility];  Service: General;  Laterality: Right;  NUCLEAR MEDICINE INJECTION - RIGHT SIDE  . BREAST REDUCTION SURGERY Bilateral 03/23/2016   Procedure: EXCISION OF EXCESS LATERAL BREAST TISSUE AND  ;  Surgeon: Wallace Going, DO;  Location: [facility];  Service: Plastics;  Laterality: Bilateral;  . BREAST SURGERY Bilateral   . BUNIONECTOMY Bilateral    spurs  . KNEE ARTHROSCOPY     left  . LIPOSUCTION Bilateral 03/23/2016   Procedure: LIPOSUCTION FOR SYMMETRY BILATERAL ;  Surgeon: Wallace Going, DO;  Location: [facility];  Service: Plastics;  Laterality: Bilateral;  . MASTECTOMY MODIFIED RADICAL Bilateral 12/24/2012   LYMPH NODE BIOPSY   . SIMPLE MASTECTOMY WITH AXILLARY SENTINEL NODE BIOPSY Bilateral 12/24/2012   Procedure: BILATERAL MASTECTOMY;  Surgeon: Odis Hollingshead, MD;  Location: [facility];  Service: General;  Laterality: Bilateral;  . Tarsal Exostectomy Left 03/23/2017   Left Foot  . TUBAL LIGATION    . VARICOSE VEIN SURGERY      Current Medications: Current Meds  Medication Sig  . B Complex-C (B-COMPLEX WITH VITAMIN C) tablet Take 1 tablet by mouth daily.  . Cholecalciferol (VITAMIN D3) 2000 UNITS TABS Take 1 tablet by mouth daily.  . Echinacea 500 MG CAPS Take by mouth.  . Garlic 10 MG CAPS Take by mouth. 4 po qd  . Ginger, Zingiber officinalis, (GINGER ROOT PO) Take by mouth.  Marland Kitchen HYDROcodone-acetaminophen (NORCO) 10-325 MG tablet Take 1 tablet by mouth every 6 (six) hours as needed.  Marland Kitchen levothyroxine (SYNTHROID, LEVOTHROID) 125 MCG tablet Take 125 mcg by mouth daily.  . Multiple Vitamins-Minerals (MULTIVITAMIN WITH MINERALS) tablet Take 1 tablet by mouth daily.     Allergies:   Patient has no known allergies.   Social History   Social History  . Marital status: Single    Spouse name: N/A  . Number of children: N/A  . Years of education: N/A   Occupational History  . retired    Social History Main Topics  . Smoking status: Former Smoker    Years: 8.00  . Smokeless tobacco: Never Used     Comment: quit in th '80's  . Alcohol use Yes     Comment: 1-2 per week  . Drug use: No  . Sexual activity: Not Currently   Other Topics Concern  . Not on file   Social History Narrative  . No narrative on file     Family History: The patient's family history includes Heart disease in her sister; Hypertension in her brother, sister, and sister.  ROS:   Please see the history of present illness.    All other systems reviewed and are  negative.  EKGs/Labs/Other Studies Reviewed:    The following studies were reviewed today: EKG reveals sinus rhythm and nonspecific ST-T changes lab work done at referring doctors offices were reviewed also.   Recent Labs: No results found for requested labs within last 8760 hours.  Recent Lipid Panel    Component Value Date/Time   CHOL 164 04/24/2014 1358   TRIG 80 04/24/2014 1358   HDL 41 04/24/2014 1358   CHOLHDL 4.0 04/24/2014 1358   VLDL 16 04/24/2014 1358   LDLCALC 107 (H) 04/24/2014 1358    Physical Exam:    VS:  BP 114/70  Pulse 93   Ht 5\' 6"  (1.676 m)   Wt 223 lb 1.3 oz (101.2 kg)   SpO2 96%   BMI 36.01 kg/m     Wt Readings from Last 3 Encounters:  05/09/17 223 lb 1.3 oz (101.2 kg)  01/24/17 220 lb (99.8 kg)  08/22/16 228 lb 14.4 oz (103.8 kg)     GEN: Patient is in no acute distress HEENT: Normal NECK: No JVD; No carotid bruits LYMPHATICS: No lymphadenopathy CARDIAC: S1 S2 regular, 2/6 systolic murmur at the apex. RESPIRATORY:  Clear to auscultation without rales, wheezing or rhonchi  ABDOMEN: Soft, non-tender, non-distended MUSCULOSKELETAL:  No edema; No deformity  SKIN: Warm and dry NEUROLOGIC:  Alert and oriented x 3 PSYCHIATRIC:  Normal affect    Signed, Jenean Lindau, MD  05/09/2017 10:56 AM    [place]

## 2017-05-18 ENCOUNTER — Ambulatory Visit: Payer: Medicare HMO

## 2017-05-18 ENCOUNTER — Other Ambulatory Visit (HOSPITAL_BASED_OUTPATIENT_CLINIC_OR_DEPARTMENT_OTHER): Payer: Medicare HMO

## 2017-05-18 ENCOUNTER — Ambulatory Visit (HOSPITAL_BASED_OUTPATIENT_CLINIC_OR_DEPARTMENT_OTHER): Payer: Medicare HMO | Admitting: Hematology & Oncology

## 2017-05-18 VITALS — BP 142/84 | HR 80 | Temp 98.2°F | Resp 18 | Ht 66.0 in | Wt 221.0 lb

## 2017-05-18 DIAGNOSIS — Z170 Estrogen receptor positive status [ER+]: Principal | ICD-10-CM

## 2017-05-18 DIAGNOSIS — Z853 Personal history of malignant neoplasm of breast: Secondary | ICD-10-CM | POA: Diagnosis not present

## 2017-05-18 DIAGNOSIS — C50411 Malignant neoplasm of upper-outer quadrant of right female breast: Secondary | ICD-10-CM

## 2017-05-18 DIAGNOSIS — C50412 Malignant neoplasm of upper-outer quadrant of left female breast: Principal | ICD-10-CM

## 2017-05-18 DIAGNOSIS — I8391 Asymptomatic varicose veins of right lower extremity: Secondary | ICD-10-CM | POA: Diagnosis not present

## 2017-05-18 DIAGNOSIS — Z9013 Acquired absence of bilateral breasts and nipples: Secondary | ICD-10-CM | POA: Diagnosis not present

## 2017-05-18 LAB — CMP (CANCER CENTER ONLY)
ALK PHOS: 109 U/L — AB (ref 26–84)
ALT: 24 U/L (ref 10–47)
AST: 27 U/L (ref 11–38)
Albumin: 3.7 g/dL (ref 3.3–5.5)
BUN: 9 mg/dL (ref 7–22)
CO2: 30 mEq/L (ref 18–33)
Calcium: 8.8 mg/dL (ref 8.0–10.3)
Chloride: 108 mEq/L (ref 98–108)
Creat: 0.8 mg/dl (ref 0.6–1.2)
GLUCOSE: 103 mg/dL (ref 73–118)
Potassium: 4.2 mEq/L (ref 3.3–4.7)
Sodium: 141 mEq/L (ref 128–145)
TOTAL PROTEIN: 7 g/dL (ref 6.4–8.1)
Total Bilirubin: 0.8 mg/dl (ref 0.20–1.60)

## 2017-05-18 LAB — CBC WITH DIFFERENTIAL (CANCER CENTER ONLY)
BASO#: 0 10*3/uL (ref 0.0–0.2)
BASO%: 0.5 % (ref 0.0–2.0)
EOS%: 3.5 % (ref 0.0–7.0)
Eosinophils Absolute: 0.2 10*3/uL (ref 0.0–0.5)
HEMATOCRIT: 41.5 % (ref 34.8–46.6)
HGB: 14.3 g/dL (ref 11.6–15.9)
LYMPH#: 1.5 10*3/uL (ref 0.9–3.3)
LYMPH%: 28 % (ref 14.0–48.0)
MCH: 31 pg (ref 26.0–34.0)
MCHC: 34.5 g/dL (ref 32.0–36.0)
MCV: 90 fL (ref 81–101)
MONO#: 0.7 10*3/uL (ref 0.1–0.9)
MONO%: 13.4 % — ABNORMAL HIGH (ref 0.0–13.0)
NEUT#: 3 10*3/uL (ref 1.5–6.5)
NEUT%: 54.6 % (ref 39.6–80.0)
PLATELETS: 199 10*3/uL (ref 145–400)
RBC: 4.62 10*6/uL (ref 3.70–5.32)
RDW: 13.1 % (ref 11.1–15.7)
WBC: 5.5 10*3/uL (ref 3.9–10.0)

## 2017-05-18 LAB — LACTATE DEHYDROGENASE: LDH: 200 U/L (ref 125–245)

## 2017-05-18 NOTE — Progress Notes (Signed)
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 New Patient (Initial Visit)  : I'm here to see you.  HPI: Lauren Farley is actually known to me. She used to work at the [employer] when I was there. She was always very nice. She is into holistic medicine.  She found a lump in her right breast back in 2014. She said that her nipple was turned out. She ultimately underwent a biopsy. This confirmed invasive ductal carcinoma of the breast. The carcinoma had papillary features. The tumor was ER positive and HER-2 negative. Of note, the pathology report was [id number].  She then underwent a bilateral mastectomy. She did not want to have her breast and did not wish to undergo radiation or chemotherapy. She had this on 12/14/2012. The pathology report ([id number]) showed invasive and in situ ductal carcinoma with papillary features of the right breast. The tumor size was 0.6 cm. All margins were negative. There is no lymphovascular invasion. She had sentinel lymph nodes that were all negative.  As such, she was a stage IB (T1bN0M0) invasive ductal carcinoma.  She did not wish to have hormonal therapy. Again she is very holistic. She's been taking her essential oils and other products.  She was calmly referred to the [facility] as she has moved out to [place].  She is doing okay. She is having some issues with varicose veins in her right leg. She pontes is seeing a vein specialist.  She is also complaining of some pain in the left upper arm. There is no swelling. There is no decreased range of motion.  She's had no fever. She's had no change in bowel or bladder habits.  She has not had any reconstruction.  She does not smoke. She does not drink. She had a total hysterectomy when she was in her 73s.  Overall, her  performance status is ECOG 0.   Past Medical History:  Diagnosis Date  . Arthritis    lt knrr, bil feet, bil hands, back  . Back pain   . Cancer (HCC)    Breast  . Constipation   . GERD (gastroesophageal reflux disease)    mygestic herbal  . History of blood transfusion    vein stripping in 70's  . Hot flashes   . Hypothyroidism   . Insomnia   . Osteoporosis   . Thyroid disease    Graves disease s/p RAI treatment  . Varicose veins   :  Past Surgical History:  Procedure Laterality Date  . ABDOMINAL HYSTERECTOMY     TAH/BSO  . AXILLARY SENTINEL NODE BIOPSY Right 12/24/2012   Procedure: RIGHT AXILLARY SENTINEL LYMPH NODE BIOPSY;  Surgeon: Odis Hollingshead, MD;  Location: [facility];  Service: General;  Laterality: Right;  NUCLEAR MEDICINE INJECTION - RIGHT SIDE  . BREAST REDUCTION SURGERY Bilateral 03/23/2016   Procedure: EXCISION OF EXCESS LATERAL BREAST TISSUE AND  ;  Surgeon: Wallace Going, DO;  Location: [facility];  Service: Plastics;  Laterality: Bilateral;  . BREAST SURGERY Bilateral   . BUNIONECTOMY Bilateral    spurs  . KNEE ARTHROSCOPY     left  . LIPOSUCTION Bilateral 03/23/2016   Procedure: LIPOSUCTION FOR SYMMETRY BILATERAL ;  Surgeon: Wallace Going, DO;  Location: [facility];  Service: Plastics;  Laterality: Bilateral;  . MASTECTOMY MODIFIED RADICAL Bilateral 12/24/2012   LYMPH NODE BIOPSY   . SIMPLE MASTECTOMY WITH AXILLARY SENTINEL NODE BIOPSY Bilateral 12/24/2012   Procedure: BILATERAL MASTECTOMY;  Surgeon: Odis Hollingshead, MD;  Location: [facility];  Service: General;  Laterality: Bilateral;  . Tarsal Exostectomy Left 03/23/2017   Left Foot  . TUBAL LIGATION    . VARICOSE VEIN SURGERY    :   Current Outpatient Prescriptions:  .  aspirin EC 81 MG tablet, Take 1 tablet (81 mg total) by mouth daily., Disp: 30 tablet, Rfl: 0 .  B Complex-C (B-COMPLEX WITH VITAMIN C) tablet, Take 1 tablet by mouth daily., Disp: , Rfl:  .   Cholecalciferol (VITAMIN D3) 2000 UNITS TABS, Take 1 tablet by mouth daily., Disp: , Rfl:  .  Echinacea 500 MG CAPS, Take by mouth., Disp: , Rfl:  .  Garlic 10 MG CAPS, Take by mouth. 4 po qd, Disp: , Rfl:  .  Ginger, Zingiber officinalis, (GINGER ROOT PO), Take by mouth., Disp: , Rfl:  .  HYDROcodone-acetaminophen (NORCO) 10-325 MG tablet, Take 1 tablet by mouth every 6 (six) hours as needed., Disp: 60 tablet, Rfl: 0 .  levothyroxine (SYNTHROID, LEVOTHROID) 125 MCG tablet, Take 125 mcg by mouth daily., Disp: , Rfl: 1 .  Multiple Vitamins-Minerals (MULTIVITAMIN WITH MINERALS) tablet, Take 1 tablet by mouth daily., Disp: , Rfl:  .  nitroGLYCERIN (NITROSTAT) 0.4 MG SL tablet, Place 1 tablet (0.4 mg total) under the tongue every 5 (five) minutes as needed for chest pain., Disp: 25 tablet, Rfl: 11:  :  No Known Allergies:  Family History  Problem Relation Age of Onset  . Heart disease Sister   . Hypertension Sister   . Hypertension Brother   . Hypertension Sister   :  Social History   Social History  . Marital status: Single    Spouse name: N/A  . Number of children: N/A  . Years of education: N/A   Occupational History  . retired    Social History Main Topics  . Smoking status: Former Smoker    Years: 8.00  . Smokeless tobacco: Never Used     Comment: quit in th '80's  . Alcohol use Yes     Comment: 1-2 per week  . Drug use: No  . Sexual activity: Not Currently   Other Topics Concern  . Not on file   Social History Narrative  . No narrative on file  :  Pertinent items are noted in HPI.  Exam:Well-developed and well-nourished African-American female. Her vital signs show a temperature of 98.2. Pulse 80. Blood pressure 142/84. Weight is 221 pounds. Head and neck exam shows no ocular or oral lesions. There are no palpable cervical or supraclavicular lymph nodes. Lungs are clear bilaterally. Cardiac exam regular rate and rhythm with no murmurs, rubs or bruits. Breast exam  shows bilateral mastectomies. These are healed. No nodules or erythema or warmth or tenderness is noted on the anterior chest walls. There is no bilateral axillary adenopathy. Abdomen is soft. She is obese. She has good bowel sounds. There is no fluid wave. There is no palpable liver or spleen tip. Back exam shows no tenderness over the spine, ribs or hips. Extremities shows no clubbing, cyanosis or edema. She does have some varicose veins in the right leg. Neurological exam shows no focal neurological deficits.    Recent Labs  05/18/17 0829  WBC 5.5  HGB 14.3  HCT 41.5  PLT 199  Recent Labs  05/18/17 0829  NA 141  K 4.2  CL 108  CO2 30  GLUCOSE 103  BUN 9  CREATININE 0.8  CALCIUM 8.8    Blood smear review:  None  Pathology:  See above     Assessment and Plan:  Lauren Farley is a very nice 71 year old postmenopausal African-American female. She has a early-stage-stage IB-ductal carcinoma of the right breast. This is a good invasive ductal carcinoma as this has papillary features.  She, because of her holistic police, did not want anti-estrogen therapy. I think this is appropriate. I think that the risk of her cancer coming back is less than 10%.  She will follow up with [place] in 6 months. We will have to see about getting her over to a pain specialist for the varicose veins in her right leg.  She has had her mastectomy so I don't think she needs mammograms.  I'm not sure she is going have a formal reconstruction.  I spent about 45 minutes with her.

## 2017-05-19 LAB — CANCER ANTIGEN 27.29: CAN 27.29: 15.8 U/mL (ref 0.0–38.6)

## 2017-06-06 ENCOUNTER — Ambulatory Visit: Payer: Medicare HMO | Admitting: Cardiology

## 2017-11-23 ENCOUNTER — Encounter: Payer: Self-pay | Admitting: Hematology & Oncology

## 2017-11-23 ENCOUNTER — Inpatient Hospital Stay: Payer: Medicare HMO | Attending: Hematology & Oncology

## 2017-11-23 ENCOUNTER — Inpatient Hospital Stay (HOSPITAL_BASED_OUTPATIENT_CLINIC_OR_DEPARTMENT_OTHER): Payer: Medicare HMO | Admitting: Hematology & Oncology

## 2017-11-23 ENCOUNTER — Other Ambulatory Visit: Payer: Self-pay

## 2017-11-23 VITALS — BP 152/92 | HR 78 | Temp 98.2°F | Resp 16 | Wt 225.0 lb

## 2017-11-23 DIAGNOSIS — Z853 Personal history of malignant neoplasm of breast: Secondary | ICD-10-CM | POA: Insufficient documentation

## 2017-11-23 DIAGNOSIS — Z79899 Other long term (current) drug therapy: Secondary | ICD-10-CM | POA: Diagnosis not present

## 2017-11-23 DIAGNOSIS — Z9013 Acquired absence of bilateral breasts and nipples: Secondary | ICD-10-CM | POA: Diagnosis not present

## 2017-11-23 DIAGNOSIS — C50412 Malignant neoplasm of upper-outer quadrant of left female breast: Secondary | ICD-10-CM

## 2017-11-23 DIAGNOSIS — C50411 Malignant neoplasm of upper-outer quadrant of right female breast: Secondary | ICD-10-CM

## 2017-11-23 DIAGNOSIS — Z170 Estrogen receptor positive status [ER+]: Secondary | ICD-10-CM

## 2017-11-23 DIAGNOSIS — Z7982 Long term (current) use of aspirin: Secondary | ICD-10-CM | POA: Diagnosis not present

## 2017-11-23 LAB — CBC WITH DIFFERENTIAL (CANCER CENTER ONLY)
Basophils Absolute: 0 10*3/uL (ref 0.0–0.1)
Basophils Relative: 1 %
EOS ABS: 0.1 10*3/uL (ref 0.0–0.5)
Eosinophils Relative: 2 %
HEMATOCRIT: 40.6 % (ref 34.8–46.6)
HEMOGLOBIN: 14 g/dL (ref 11.6–15.9)
LYMPHS ABS: 2.4 10*3/uL (ref 0.9–3.3)
LYMPHS PCT: 38 %
MCH: 30.6 pg (ref 26.0–34.0)
MCHC: 34.5 g/dL (ref 32.0–36.0)
MCV: 88.8 fL (ref 81.0–101.0)
MONOS PCT: 12 %
Monocytes Absolute: 0.7 10*3/uL (ref 0.1–0.9)
NEUTROS ABS: 3 10*3/uL (ref 1.5–6.5)
NEUTROS PCT: 47 %
Platelet Count: 192 10*3/uL (ref 145–400)
RBC: 4.57 MIL/uL (ref 3.70–5.32)
RDW: 13.1 % (ref 11.1–15.7)
WBC: 6.3 10*3/uL (ref 3.9–10.0)

## 2017-11-23 LAB — LACTATE DEHYDROGENASE: LDH: 188 U/L (ref 125–245)

## 2017-11-23 LAB — CMP (CANCER CENTER ONLY)
ALK PHOS: 114 U/L (ref 40–150)
ALT: 20 U/L (ref 0–55)
AST: 19 U/L (ref 5–34)
Albumin: 3.7 g/dL (ref 3.5–5.0)
Anion gap: 7 (ref 3–11)
BILIRUBIN TOTAL: 0.4 mg/dL (ref 0.2–1.2)
BUN: 13 mg/dL (ref 7–26)
CALCIUM: 9.3 mg/dL (ref 8.4–10.4)
CO2: 27 mmol/L (ref 22–29)
Chloride: 107 mmol/L (ref 98–109)
Creatinine: 0.87 mg/dL (ref 0.60–1.10)
GFR, Estimated: >60 mL/min (ref >60)
Glucose, Bld: 93 mg/dL (ref 70–140)
Potassium: 4.2 mmol/L (ref 3.5–5.1)
SODIUM: 141 mmol/L (ref 136–145)
TOTAL PROTEIN: 6.8 g/dL (ref 6.4–8.3)

## 2017-11-23 NOTE — Progress Notes (Signed)
Hematology and Oncology Follow Up Visit  Lauren Farley [id number] 1945-11-24 72 y.o. 11/23/2017   Principle Diagnosis:   Stage Ib (T1bN0M0) ductal carcinoma of the right breast-ER positive/HER-2 negative  Current Therapy:    Status post bilateral mastectomy -12/14/2012     Interim History:  Lauren Farley is back for second office visit.  We saw her 6 months ago.  She is doing well.  She has had no problems since we last saw her although she did fall around the holidays.  She hurt her left rotator cuff.  She does not want surgery for this.  She is doing some physical therapy.  Is having a tough time sleeping.  It is painful to sleep on her left side.  She has had no nausea or vomiting.  She is had no cough or shortness of breath.  She has had no change in bowel or bladder habits.  She believes in holistic medicine.  She has her own remedies that she uses.  She has had no rashes.  There is been no leg swelling.  Overall, her performance status is ECOG 1.  Medications:  Current Outpatient Medications:  .  aspirin EC 81 MG tablet, Take 1 tablet (81 mg total) by mouth daily., Disp: 30 tablet, Rfl: 0 .  B Complex-C (B-COMPLEX WITH VITAMIN C) tablet, Take 1 tablet by mouth daily., Disp: , Rfl:  .  Cholecalciferol (VITAMIN D3) 2000 UNITS TABS, Take 1 tablet by mouth daily., Disp: , Rfl:  .  Echinacea 500 MG CAPS, Take by mouth., Disp: , Rfl:  .  Garlic 10 MG CAPS, Take by mouth. 4 po qd, Disp: , Rfl:  .  Ginger, Zingiber officinalis, (GINGER ROOT PO), Take by mouth., Disp: , Rfl:  .  HYDROcodone-acetaminophen (NORCO) 10-325 MG tablet, Take 1 tablet by mouth every 6 (six) hours as needed., Disp: 60 tablet, Rfl: 0 .  Incontinence Supply Disposable (BLADDER CONTROL PADS EX ABSORB) MISC, , Disp: , Rfl:  .  levothyroxine (SYNTHROID, LEVOTHROID) 125 MCG tablet, Take 125 mcg by mouth daily., Disp: , Rfl: 1 .  Magnesium Oxide 400 (240 Mg) MG TABS, Take 400 mg by mouth., Disp: , Rfl:  .   Multiple Vitamins-Minerals (MULTIVITAMIN WITH MINERALS) tablet, Take 1 tablet by mouth daily., Disp: , Rfl:  .  nitroGLYCERIN (NITROSTAT) 0.4 MG SL tablet, Place 1 tablet (0.4 mg total) under the tongue every 5 (five) minutes as needed for chest pain., Disp: 25 tablet, Rfl: 11 .  omega-3 fish oil (MAXEPA) 1000 MG CAPS capsule, , Disp: , Rfl:  .  PSYLLIUM FIBER PO, , Disp: , Rfl:   Allergies: No Known Allergies  Past Medical History, Surgical history, Social history, and Family History were reviewed and updated.  Review of Systems: Review of Systems  Constitutional: Negative.   HENT:  Negative.   Eyes: Negative.   Respiratory: Negative.   Gastrointestinal: Negative.   Endocrine: Negative.   Genitourinary: Negative.    Musculoskeletal: Positive for myalgias.  Neurological: Negative.   Hematological: Negative.   Psychiatric/Behavioral: Negative.     Physical Exam:  weight is 225 lb (102.1 kg). Her oral temperature is 98.2 F (36.8 C). Her blood pressure is 152/92 (abnormal) and her pulse is 78. Her respiration is 16 and oxygen saturation is 100%.   Wt Readings from Last 3 Encounters:  11/23/17 225 lb (102.1 kg)  05/18/17 221 lb (100.2 kg)  05/09/17 223 lb 1.3 oz (101.2 kg)    Physical Exam  Constitutional: She  is oriented to person, place, and time.  HENT:  Head: Normocephalic and atraumatic.  Mouth/Throat: Oropharynx is clear and moist.  Eyes: EOM are normal. Pupils are equal, round, and reactive to light.  Neck: Normal range of motion.  Cardiovascular: Normal rate, regular rhythm and normal heart sounds.  Pulmonary/Chest: Effort normal and breath sounds normal.  Abdominal: Soft. Bowel sounds are normal.  Musculoskeletal: Normal range of motion. She exhibits no edema, tenderness or deformity.  Lymphadenopathy:    She has no cervical adenopathy.  Neurological: She is alert and oriented to person, place, and time.  Skin: Skin is warm and dry. No rash noted. No erythema.    Psychiatric: She has a normal mood and affect. Her behavior is normal. Judgment and thought content normal.  Vitals reviewed.    Lab Results  Component Value Date   WBC 6.3 11/23/2017   HGB 14.3 05/18/2017   HCT 40.6 11/23/2017   MCV 88.8 11/23/2017   PLT 192 11/23/2017     Chemistry      Component Value Date/Time   NA 141 05/18/2017 0829   NA 141 08/18/2014 0848   K 4.2 05/18/2017 0829   K 3.8 08/18/2014 0848   CL 108 05/18/2017 0829   CL 106 12/04/2012 0826   CO2 30 05/18/2017 0829   CO2 22 08/18/2014 0848   BUN 9 05/18/2017 0829   BUN 14.4 08/18/2014 0848   CREATININE 0.8 05/18/2017 0829   CREATININE 0.9 08/18/2014 0848      Component Value Date/Time   CALCIUM 8.8 05/18/2017 0829   CALCIUM 9.3 08/18/2014 0848   ALKPHOS 109 (H) 05/18/2017 0829   ALKPHOS 123 08/18/2014 0848   AST 27 05/18/2017 0829   AST 19 08/18/2014 0848   ALT 24 05/18/2017 0829   ALT 24 08/18/2014 0848   BILITOT 0.80 05/18/2017 0829   BILITOT 0.35 08/18/2014 0848      Impression and Plan: Lauren Farley is a 72 year old Afro-American female.  She has a past history of a stage Ib carcinoma of the right breast.  She now is about 5 years out from mastectomy.  She did not elect for any hormonal therapy.  I think the risk of recurrence is going to be less than 10%.  We will plan to get her back in another 6 months.   [name], MD 2/8/20198:38 AM

## 2018-05-24 ENCOUNTER — Encounter: Payer: Self-pay | Admitting: Hematology & Oncology

## 2018-05-24 ENCOUNTER — Other Ambulatory Visit: Payer: Self-pay

## 2018-05-24 ENCOUNTER — Inpatient Hospital Stay: Payer: Medicare HMO | Attending: Hematology & Oncology | Admitting: Hematology & Oncology

## 2018-05-24 ENCOUNTER — Inpatient Hospital Stay: Payer: Medicare HMO

## 2018-05-24 VITALS — BP 150/89 | HR 71 | Temp 98.2°F | Resp 19 | Wt 223.0 lb

## 2018-05-24 DIAGNOSIS — Z853 Personal history of malignant neoplasm of breast: Secondary | ICD-10-CM

## 2018-05-24 DIAGNOSIS — Z9011 Acquired absence of right breast and nipple: Secondary | ICD-10-CM | POA: Insufficient documentation

## 2018-05-24 DIAGNOSIS — Z170 Estrogen receptor positive status [ER+]: Principal | ICD-10-CM

## 2018-05-24 DIAGNOSIS — C50411 Malignant neoplasm of upper-outer quadrant of right female breast: Secondary | ICD-10-CM

## 2018-05-24 LAB — CBC WITH DIFFERENTIAL (CANCER CENTER ONLY)
BASOS ABS: 0 10*3/uL (ref 0.0–0.1)
BASOS PCT: 1 %
Eosinophils Absolute: 0.1 10*3/uL (ref 0.0–0.5)
Eosinophils Relative: 2 %
HEMATOCRIT: 40.8 % (ref 34.8–46.6)
Hemoglobin: 13.8 g/dL (ref 11.6–15.9)
Lymphocytes Relative: 34 %
Lymphs Abs: 1.8 10*3/uL (ref 0.9–3.3)
MCH: 30.7 pg (ref 26.0–34.0)
MCHC: 33.8 g/dL (ref 32.0–36.0)
MCV: 90.7 fL (ref 81.0–101.0)
MONO ABS: 0.6 10*3/uL (ref 0.1–0.9)
Monocytes Relative: 12 %
NEUTROS ABS: 2.8 10*3/uL (ref 1.5–6.5)
Neutrophils Relative %: 51 %
PLATELETS: 171 10*3/uL (ref 145–400)
RBC: 4.5 MIL/uL (ref 3.70–5.32)
RDW: 13.5 % (ref 11.1–15.7)
WBC Count: 5.3 10*3/uL (ref 3.9–10.0)

## 2018-05-24 LAB — CMP (CANCER CENTER ONLY)
ALBUMIN: 4 g/dL (ref 3.5–5.0)
ALT: 21 U/L (ref 0–44)
AST: 24 U/L (ref 15–41)
Alkaline Phosphatase: 121 U/L (ref 38–126)
Anion gap: 8 (ref 5–15)
BUN: 10 mg/dL (ref 8–23)
CHLORIDE: 104 mmol/L (ref 98–111)
CO2: 27 mmol/L (ref 22–32)
Calcium: 9.3 mg/dL (ref 8.9–10.3)
Creatinine: 0.91 mg/dL (ref 0.44–1.00)
GFR, Est AFR Am: >60 mL/min (ref >60)
GFR, Estimated: >60 mL/min (ref >60)
GLUCOSE: 94 mg/dL (ref 70–99)
POTASSIUM: 4.4 mmol/L (ref 3.5–5.1)
SODIUM: 139 mmol/L (ref 135–145)
Total Bilirubin: 0.6 mg/dL (ref 0.3–1.2)
Total Protein: 7.1 g/dL (ref 6.5–8.1)
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
## 2018-11-22 ENCOUNTER — Inpatient Hospital Stay: Payer: Medicare HMO

## 2018-11-22 ENCOUNTER — Inpatient Hospital Stay: Payer: Medicare HMO | Attending: Hematology & Oncology | Admitting: Hematology & Oncology

## 2020-12-09 ENCOUNTER — Telehealth: Payer: Self-pay

## 2020-12-09 NOTE — Telephone Encounter (Signed)
S/w pt/returned her call to r/s her 2020 no show appt     anne

## 2021-01-14 ENCOUNTER — Other Ambulatory Visit: Payer: Self-pay

## 2021-01-14 DIAGNOSIS — C50411 Malignant neoplasm of upper-outer quadrant of right female breast: Secondary | ICD-10-CM

## 2021-01-17 ENCOUNTER — Inpatient Hospital Stay: Payer: Medicare HMO | Attending: Hematology & Oncology

## 2021-01-17 ENCOUNTER — Other Ambulatory Visit: Payer: Self-pay

## 2021-01-17 ENCOUNTER — Inpatient Hospital Stay: Payer: Medicare HMO | Attending: Hematology & Oncology | Admitting: Hematology & Oncology

## 2021-01-17 ENCOUNTER — Encounter: Payer: Self-pay | Admitting: Hematology & Oncology

## 2021-01-17 VITALS — BP 148/80 | HR 95 | Temp 98.9°F | Resp 18 | Wt 217.0 lb

## 2021-01-17 DIAGNOSIS — C50411 Malignant neoplasm of upper-outer quadrant of right female breast: Secondary | ICD-10-CM | POA: Diagnosis not present

## 2021-01-17 DIAGNOSIS — C50911 Malignant neoplasm of unspecified site of right female breast: Secondary | ICD-10-CM | POA: Insufficient documentation

## 2021-01-17 DIAGNOSIS — Z170 Estrogen receptor positive status [ER+]: Secondary | ICD-10-CM | POA: Insufficient documentation

## 2021-01-17 LAB — CBC WITH DIFFERENTIAL (CANCER CENTER ONLY)
Abs Immature Granulocytes: 0.03 10*3/uL (ref 0.00–0.07)
Basophils Absolute: 0.1 10*3/uL (ref 0.0–0.1)
Basophils Relative: 1 %
Eosinophils Absolute: 0.1 10*3/uL (ref 0.0–0.5)
Eosinophils Relative: 2 %
HCT: 42.2 % (ref 36.0–46.0)
Hemoglobin: 14.5 g/dL (ref 12.0–15.0)
Immature Granulocytes: 1 %
Lymphocytes Relative: 32 %
Lymphs Abs: 2.1 10*3/uL (ref 0.7–4.0)
MCH: 30.2 pg (ref 26.0–34.0)
MCHC: 34.4 g/dL (ref 30.0–36.0)
MCV: 87.9 fL (ref 80.0–100.0)
Monocytes Absolute: 0.7 10*3/uL (ref 0.1–1.0)
Monocytes Relative: 10 %
Neutro Abs: 3.4 10*3/uL (ref 1.7–7.7)
Neutrophils Relative %: 54 %
Platelet Count: 213 10*3/uL (ref 150–400)
RBC: 4.8 MIL/uL (ref 3.87–5.11)
RDW: 13.1 % (ref 11.5–15.5)
WBC Count: 6.4 10*3/uL (ref 4.0–10.5)
nRBC: 0 % (ref 0.0–0.2)

## 2021-01-17 LAB — CMP (CANCER CENTER ONLY)
ALT: 19 U/L (ref 0–44)
AST: 19 U/L (ref 15–41)
Albumin: 4.2 g/dL (ref 3.5–5.0)
Alkaline Phosphatase: 112 U/L (ref 38–126)
Anion gap: 9 (ref 5–15)
BUN: 12 mg/dL (ref 8–23)
CO2: 27 mmol/L (ref 22–32)
Calcium: 9.8 mg/dL (ref 8.9–10.3)
Chloride: 105 mmol/L (ref 98–111)
Creatinine: 0.8 mg/dL (ref 0.44–1.00)
GFR, Estimated: >60 mL/min (ref >60)
Glucose, Bld: 98 mg/dL (ref 70–99)
Potassium: 3.3 mmol/L — ABNORMAL LOW (ref 3.5–5.1)
Sodium: 141 mmol/L (ref 135–145)
Total Bilirubin: 0.5 mg/dL (ref 0.3–1.2)
Total Protein: 7.2 g/dL (ref 6.5–8.1)

## 2021-01-17 NOTE — Progress Notes (Signed)
Hematology and Oncology Follow Up Visit  Lauren Farley [id number] 02-27-1946 75 y.o. 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  We last saw her back in August 2019.  Her problem right now is that she is worried about swelling on her chest wall.  I am unsure exactly what this might indicate.  She may have lymphedema.  She saw her family doctor.  Her family doctor felt that Lauren Farley needed to come back to see [place].  Otherwise she seems to be doing pretty well.  She has had no fever.  She has had no issues with the coronavirus.  She is still been doing her holistic remedies.  This has been quite helpful for her.  She has not had any issues with bleeding.  There is no change in bowel or bladder habits.  She has had no cough.  She has had no swallowing difficulties.  There is no problems with taste.  She is not noted any arm swelling outside of may be some swelling in the right upper arm.  She has not had any scans done.  Overall, I would say her performance status is ECOG 0. Medications:  Current Outpatient Medications:  .  losartan (COZAAR) 50 MG tablet, Take 50 mg by mouth daily., Disp: , Rfl:  .  naproxen (NAPROSYN) 500 MG tablet, Take 500 mg by mouth., Disp: , Rfl:  .  aspirin EC 81 MG tablet, Take 1 tablet (81 mg total) by mouth daily., Disp: 30 tablet, Rfl: 0 .  B Complex-C (B-COMPLEX WITH VITAMIN C) tablet, Take 1 tablet by mouth daily., Disp: , Rfl:  .  Cholecalciferol (VITAMIN D3) 2000 UNITS TABS, Take 1 tablet by mouth daily., Disp: , Rfl:  .  DANDELION PO, Take by mouth., Disp: , Rfl:  .  Echinacea 500 MG CAPS, Take by mouth., Disp: , Rfl:  .  Garlic 10 MG CAPS, Take by mouth. 4 po qd, Disp: , Rfl:  .  Ginger, Zingiber officinalis, (GINGER ROOT PO),  Take by mouth., Disp: , Rfl:  .  HYDROcodone-acetaminophen (NORCO) 10-325 MG tablet, Take 1 tablet by mouth every 6 (six) hours as needed., Disp: 60 tablet, Rfl: 0 .  levothyroxine (SYNTHROID, LEVOTHROID) 125 MCG tablet, Take 125 mcg by mouth daily., Disp: , Rfl: 1 .  Magnesium Oxide 400 (240 Mg) MG TABS, Take 400 mg by mouth., Disp: , Rfl:  .  Multiple Vitamins-Minerals (MULTIVITAMIN WITH MINERALS) tablet, Take 1 tablet by mouth daily., Disp: , Rfl:  .  omega-3 fish oil (MAXEPA) 1000 MG CAPS capsule, , Disp: , Rfl:   Allergies:  Allergies  Allergen Reactions  . Gabapentin Other (See Comments) and Nausea Only    Past Medical History, Surgical history, Social history, and Family History were reviewed and updated.  Review of Systems: Review of Systems  Constitutional: Negative.   HENT:  Negative.   Eyes: Negative.   Respiratory: Positive for chest tightness.   Cardiovascular: Negative.   Gastrointestinal: Negative.     Physical Exam:  weight is 217 lb (98.4 kg). Her oral temperature is 98.9 F (37.2 C). Her blood pressure is 148/80 (abnormal) and her pulse is 95. Her respiration is 18 and oxygen saturation is 98%.   Wt Readings from Last 3 Encounters:  01/17/21  217 lb (98.4 kg)  05/24/18 223 lb (101.2 kg)  11/23/17 225 lb (102.1 kg)    Physical Exam Vitals reviewed.  Constitutional:      Comments: Chest wall exam shows bilateral mastectomies.  Maybe a little bit of fullness in the upper chest wall bilaterally.  I do not feel any obvious fluid pockets.  There is no axillary pockets of fluid.  There is little fullness in the lateral chest wall at the lateral end of the mastectomy scars.  HENT:     Head: Normocephalic and atraumatic.  Eyes:     Pupils: Pupils are equal, round, and reactive to light.  Cardiovascular:     Rate and Rhythm: Normal rate and regular rhythm.     Heart sounds: Normal heart sounds.  Pulmonary:     Effort: Pulmonary effort is normal.     Breath  sounds: Normal breath sounds.  Abdominal:     General: Bowel sounds are normal.     Palpations: Abdomen is soft.  Musculoskeletal:        General: No tenderness or deformity. Normal range of motion.     Cervical back: Normal range of motion.  Lymphadenopathy:     Cervical: No cervical adenopathy.  Skin:    General: Skin is warm and dry.     Findings: No erythema or rash.  Neurological:     Mental Status: She is alert and oriented to person, place, and time.  Psychiatric:        Behavior: Behavior normal.        Thought Content: Thought content normal.        Judgment: Judgment normal.      Lab Results  Component Value Date   WBC 6.4 01/17/2021   HGB 14.5 01/17/2021   HCT 42.2 01/17/2021   MCV 87.9 01/17/2021   PLT 213 01/17/2021     Chemistry      Component Value Date/Time   NA 141 01/17/2021 1131   NA 141 05/18/2017 0829   NA 141 08/18/2014 0848   K 3.3 (L) 01/17/2021 1131   K 4.2 05/18/2017 0829   K 3.8 08/18/2014 0848   CL 105 01/17/2021 1131   CL 108 05/18/2017 0829   CL 106 12/04/2012 0826   CO2 27 01/17/2021 1131   CO2 30 05/18/2017 0829   CO2 22 08/18/2014 0848   BUN 12 01/17/2021 1131   BUN 9 05/18/2017 0829   BUN 14.4 08/18/2014 0848   CREATININE 0.80 01/17/2021 1131   CREATININE 0.8 05/18/2017 0829   CREATININE 0.9 08/18/2014 0848      Component Value Date/Time   CALCIUM 9.8 01/17/2021 1131   CALCIUM 8.8 05/18/2017 0829   CALCIUM 9.3 08/18/2014 0848   ALKPHOS 112 01/17/2021 1131   ALKPHOS 109 (H) 05/18/2017 0829   ALKPHOS 123 08/18/2014 0848   AST 19 01/17/2021 1131   AST 19 08/18/2014 0848   ALT 19 01/17/2021 1131   ALT 24 05/18/2017 0829   ALT 24 08/18/2014 0848   BILITOT 0.5 01/17/2021 1131   BILITOT 0.35 08/18/2014 0848      Impression and Plan: Lauren Farley is a very nice 75 year old African-American female.  She has had breast cancer of the right breast.  She underwent bilateral mastectomies.  This was 8 years ago.  It is certainly  possible that she may have lymphedema.  It would be worthwhile doing a CT scan just make sure nothing else is going on.  She actually is going to  see a massage therapist tomorrow.  I really do not have a problem with this.  I think massage therapy is definitely a helpful intervention for lymphedema.  Again, I do not think that the cancer is come back.  She had very early stage breast cancer.  She will continue on her holistic therapy.  She will come back when she needs [place].  She has always taken this avenue of follow-up.  I have no problems with this because she is always so conscientious.   [name], MD 4/4/202212:30 PM

## 2021-01-20 ENCOUNTER — Ambulatory Visit (HOSPITAL_BASED_OUTPATIENT_CLINIC_OR_DEPARTMENT_OTHER)
Admission: RE | Admit: 2021-01-20 | Discharge: 2021-01-20 | Disposition: A | Discharge status: Home / Self Care | Payer: Medicare HMO | Source: Ambulatory Visit | Attending: Hematology & Oncology | Admitting: Hematology & Oncology

## 2021-01-20 ENCOUNTER — Other Ambulatory Visit: Payer: Self-pay

## 2021-01-20 ENCOUNTER — Encounter (HOSPITAL_BASED_OUTPATIENT_CLINIC_OR_DEPARTMENT_OTHER): Payer: Self-pay

## 2021-01-20 DIAGNOSIS — C50411 Malignant neoplasm of upper-outer quadrant of right female breast: Secondary | ICD-10-CM | POA: Insufficient documentation

## 2021-01-20 MED ORDER — IOHEXOL 300 MG/ML  SOLN
100.0000 mL | Freq: Once | INTRAMUSCULAR | Status: AC | PRN
  Administered 2021-01-20: 75 mL via INTRAVENOUS

## 2021-01-21 ENCOUNTER — Telehealth: Payer: Self-pay | Admitting: *Deleted

## 2021-01-21 NOTE — Telephone Encounter (Signed)
-----   Message from [name], MD sent at 01/21/2021  1:04 PM EDT ----- Call - the CT scan does not show any problems!!!  There may be a little scarring.  No cancer!!  Lauren Farley

## 2021-01-21 NOTE — Telephone Encounter (Signed)
Patient notified per order of Dr. Marin Olp that "the Ct scan does not show any problems!!!  There may be a little scarring.  No cancer!! Lauren Farley"  Pt appreciative of call and has no questions at this time.

## 2021-02-21 ENCOUNTER — Inpatient Hospital Stay: Payer: Medicare HMO

## 2021-02-21 ENCOUNTER — Inpatient Hospital Stay: Payer: Medicare HMO | Admitting: Hematology & Oncology

## 2021-03-02 ENCOUNTER — Inpatient Hospital Stay: Payer: Medicare HMO | Attending: Hematology & Oncology

## 2021-03-02 ENCOUNTER — Inpatient Hospital Stay: Payer: Medicare HMO | Admitting: Hematology & Oncology

## 2021-10-06 ENCOUNTER — Other Ambulatory Visit: Payer: Medicare HMO

## 2021-10-06 ENCOUNTER — Ambulatory Visit: Payer: Medicare HMO | Admitting: Hematology & Oncology

## 2021-11-07 ENCOUNTER — Encounter: Payer: Self-pay | Admitting: Hematology & Oncology

## 2021-11-07 ENCOUNTER — Inpatient Hospital Stay (HOSPITAL_BASED_OUTPATIENT_CLINIC_OR_DEPARTMENT_OTHER): Payer: Medicare HMO | Admitting: Hematology & Oncology

## 2021-11-07 ENCOUNTER — Other Ambulatory Visit: Payer: Self-pay

## 2021-11-07 ENCOUNTER — Inpatient Hospital Stay: Payer: Medicare HMO | Attending: Hematology & Oncology

## 2021-11-07 VITALS — BP 130/48 | HR 75 | Temp 98.6°F | Resp 16 | Wt 214.0 lb

## 2021-11-07 DIAGNOSIS — R0789 Other chest pain: Secondary | ICD-10-CM | POA: Insufficient documentation

## 2021-11-07 DIAGNOSIS — C50411 Malignant neoplasm of upper-outer quadrant of right female breast: Secondary | ICD-10-CM | POA: Diagnosis not present

## 2021-11-07 DIAGNOSIS — Z9013 Acquired absence of bilateral breasts and nipples: Secondary | ICD-10-CM | POA: Insufficient documentation

## 2021-11-07 DIAGNOSIS — Z853 Personal history of malignant neoplasm of breast: Secondary | ICD-10-CM | POA: Insufficient documentation

## 2021-11-07 LAB — CBC WITH DIFFERENTIAL (CANCER CENTER ONLY)
Abs Immature Granulocytes: 0.03 10*3/uL (ref 0.00–0.07)
Basophils Absolute: 0.1 10*3/uL (ref 0.0–0.1)
Basophils Relative: 1 %
Eosinophils Absolute: 0.1 10*3/uL (ref 0.0–0.5)
Eosinophils Relative: 2 %
HCT: 40.6 % (ref 36.0–46.0)
Hemoglobin: 13.9 g/dL (ref 12.0–15.0)
Immature Granulocytes: 0 %
Lymphocytes Relative: 35 %
Lymphs Abs: 2.8 10*3/uL (ref 0.7–4.0)
MCH: 30.2 pg (ref 26.0–34.0)
MCHC: 34.2 g/dL (ref 30.0–36.0)
MCV: 88.3 fL (ref 80.0–100.0)
Monocytes Absolute: 0.9 10*3/uL (ref 0.1–1.0)
Monocytes Relative: 12 %
Neutro Abs: 4 10*3/uL (ref 1.7–7.7)
Neutrophils Relative %: 50 %
Platelet Count: 218 10*3/uL (ref 150–400)
RBC: 4.6 MIL/uL (ref 3.87–5.11)
RDW: 13.2 % (ref 11.5–15.5)
WBC Count: 7.9 10*3/uL (ref 4.0–10.5)
nRBC: 0 % (ref 0.0–0.2)

## 2021-11-07 LAB — CMP (CANCER CENTER ONLY)
ALT: 14 U/L (ref 0–44)
AST: 17 U/L (ref 15–41)
Albumin: 4.3 g/dL (ref 3.5–5.0)
Alkaline Phosphatase: 135 U/L — ABNORMAL HIGH (ref 38–126)
Anion gap: 7 (ref 5–15)
BUN: 13 mg/dL (ref 8–23)
CO2: 27 mmol/L (ref 22–32)
Calcium: 10.1 mg/dL (ref 8.9–10.3)
Chloride: 105 mmol/L (ref 98–111)
Creatinine: 0.86 mg/dL (ref 0.44–1.00)
GFR, Estimated: >60 mL/min (ref >60)
Glucose, Bld: 84 mg/dL (ref 70–99)
Potassium: 3.8 mmol/L (ref 3.5–5.1)
Sodium: 139 mmol/L (ref 135–145)
Total Bilirubin: 0.5 mg/dL (ref 0.3–1.2)
Total Protein: 7.2 g/dL (ref 6.5–8.1)

## 2021-11-07 NOTE — Progress Notes (Signed)
°Hematology and Oncology Follow Up Visit ° °Lauren Farley °[id number] °12/07/1945 75 y.o. 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  We always see her whenever she has a problem.  She says she fell 3 weeks ago.  She lost her balance.  She fell on her right side.  Of course, she did not see a doctor.  She did not have any x-rays.  She said that she has some numbness in the right hand.  She also complained of pain on the right anterior chest wall..  There is no swelling over on the right arm. ° °Otherwise, she has been doing okay.  She does do a lot of holistic therapy. ° °She has had no problems with COVID. ° °She has had no change in bowel or bladder habits. ° °There is been no real weakness over on the right hand. ° °Overall, I would say performance status is probably ECOG 1.   ° ° °Medications:  °Current Outpatient Medications:  °  atorvastatin (LIPITOR) 20 MG tablet, Take 20 mg by mouth at bedtime., Disp: , Rfl:  °  B Complex-C (B-COMPLEX WITH VITAMIN C) tablet, Take 1 tablet by mouth daily., Disp: , Rfl:  °  Cholecalciferol (VITAMIN D3) 2000 UNITS TABS, Take 1 tablet by mouth daily., Disp: , Rfl:  °  DANDELION PO, Take by mouth., Disp: , Rfl:  °  Echinacea 500 MG CAPS, Take by mouth., Disp: , Rfl:  °  Garlic 10 MG CAPS, Take by mouth. 4 po qd, Disp: , Rfl:  °  Ginger, Zingiber officinalis, (GINGER ROOT PO), Take by mouth., Disp: , Rfl:  °  HYDROcodone-acetaminophen (NORCO) 10-325 MG tablet, Take 1 tablet by mouth every 6 (six) hours as needed., Disp: 60 tablet, Rfl: 0 °  levothyroxine (SYNTHROID, LEVOTHROID) 125 MCG tablet, Take 125 mcg by mouth daily., Disp: , Rfl: 1 °  Magnesium Oxide 400 (240 Mg) MG TABS, Take 400 mg by mouth., Disp: , Rfl:  °  Multiple Vitamins-Minerals (MULTIVITAMIN  WITH MINERALS) tablet, Take 1 tablet by mouth daily., Disp: , Rfl:  ° °Allergies:  °Allergies  °Allergen Reactions  ° Gabapentin Other (See Comments) and Nausea Only  ° ° °Past Medical History, Surgical history, Social history, and Family History were reviewed and updated. ° °Review of Systems: °Review of Systems  °Constitutional: Negative.   °HENT:  Negative.    °Eyes: Negative.   °Respiratory:  Positive for chest tightness.   °Cardiovascular: Negative.   °Gastrointestinal: Negative.   ° °Physical Exam: ° weight is 214 lb (97.1 kg). Her oral temperature is 98.6 °F (37 °C). Her blood pressure is 130/48 (abnormal) and her pulse is 75. Her respiration is 16 and oxygen saturation is 99%.  ° °Wt Readings from Last 3 Encounters:  °11/07/21 214 lb (97.1 kg)  °01/17/21 217 lb (98.4 kg)  °05/24/18 223 lb (101.2 kg)  ° ° °Physical Exam °Vitals reviewed.  °Constitutional:   °   Comments: Chest wall exam shows bilateral mastectomies.  Maybe a little bit of fullness in the upper chest wall bilaterally.  I do not feel any obvious fluid pockets.  There is no axillary pockets of fluid.  There is little fullness in the lateral chest wall at the lateral end of the mastectomy scars.  °HENT:  °     Head: Normocephalic and atraumatic.  °Eyes:  °   Pupils: Pupils are equal, round, and reactive to light.  °Cardiovascular:  °   Rate and Rhythm: Normal rate and regular rhythm.  °   Heart sounds: Normal heart sounds.  °Pulmonary:  °   Effort: Pulmonary effort is normal.  °   Breath sounds: Normal breath sounds.  °Abdominal:  °   General: Bowel sounds are normal.  °   Palpations: Abdomen is soft.  °Musculoskeletal:     °   General: No tenderness or deformity. Normal range of motion.  °   Cervical back: Normal range of motion.  °Lymphadenopathy:  °   Cervical: No cervical adenopathy.  °Skin: °   General: Skin is warm and dry.  °   Findings: No erythema or rash.  °Neurological:  °   Mental Status: She is alert and oriented to person, place, and  time.  °Psychiatric:     °   Behavior: Behavior normal.     °   Thought Content: Thought content normal.     °   Judgment: Judgment normal.  ° ° ° °Lab Results  °Component Value Date  ° WBC 7.9 11/07/2021  ° HGB 13.9 11/07/2021  ° HCT 40.6 11/07/2021  ° MCV 88.3 11/07/2021  ° PLT 218 11/07/2021  ° °  Chemistry   °   °Component Value Date/Time  ° NA 139 11/07/2021 1001  ° NA 141 05/18/2017 0829  ° NA 141 08/18/2014 0848  ° K 3.8 11/07/2021 1001  ° K 4.2 05/18/2017 0829  ° K 3.8 08/18/2014 0848  ° CL 105 11/07/2021 1001  ° CL 108 05/18/2017 0829  ° CL 106 12/04/2012 0826  ° CO2 27 11/07/2021 1001  ° CO2 30 05/18/2017 0829  ° CO2 22 08/18/2014 0848  ° BUN 13 11/07/2021 1001  ° BUN 9 05/18/2017 0829  ° BUN 14.4 08/18/2014 0848  ° CREATININE 0.86 11/07/2021 1001  ° CREATININE 0.8 05/18/2017 0829  ° CREATININE 0.9 08/18/2014 0848  °    °Component Value Date/Time  ° CALCIUM 10.1 11/07/2021 1001  ° CALCIUM 8.8 05/18/2017 0829  ° CALCIUM 9.3 08/18/2014 0848  ° ALKPHOS 135 (H) 11/07/2021 1001  ° ALKPHOS 109 (H) 05/18/2017 0829  ° ALKPHOS 123 08/18/2014 0848  ° AST 17 11/07/2021 1001  ° AST 19 08/18/2014 0848  ° ALT 14 11/07/2021 1001  ° ALT 24 05/18/2017 0829  ° ALT 24 08/18/2014 0848  ° BILITOT 0.5 11/07/2021 1001  ° BILITOT 0.35 08/18/2014 0848  °  ° ° °Impression and Plan: °Lauren Farley is a very nice 75-year-old African-American female.  She has had breast cancer of the right breast.  She underwent bilateral mastectomies.  This was 8 years ago. ° °She had a CAT scan that was done back in April.  We can certainly do another one to compare to see if there are any changes. ° °I told her that she really needs to see her family doctor about the right arm.  This is nothing to do with her breast cancer.  She says she just does not like seeing other doctors. ° °As always, we will plan to get her back whenever she has any issues. ° °It is certainly possible that she may have lymphedema.  It would be worthwhile doing a CT scan just  make sure nothing else is going on. ° ° °Peter R Ennever, MD °1/23/202312:02 PM °

## 2021-11-08 LAB — CANCER ANTIGEN 27.29: CA 27.29: 8.9 U/mL (ref 0.0–38.6)

## 2021-12-07 ENCOUNTER — Other Ambulatory Visit: Payer: Self-pay

## 2021-12-07 ENCOUNTER — Ambulatory Visit (HOSPITAL_BASED_OUTPATIENT_CLINIC_OR_DEPARTMENT_OTHER)
Admission: RE | Admit: 2021-12-07 | Discharge: 2021-12-07 | Disposition: A | Discharge status: Home / Self Care | Payer: Medicare HMO | Source: Ambulatory Visit | Attending: Hematology & Oncology | Admitting: Hematology & Oncology

## 2021-12-07 ENCOUNTER — Encounter (HOSPITAL_BASED_OUTPATIENT_CLINIC_OR_DEPARTMENT_OTHER): Payer: Self-pay

## 2021-12-07 DIAGNOSIS — C50411 Malignant neoplasm of upper-outer quadrant of right female breast: Secondary | ICD-10-CM

## 2021-12-07 MED ORDER — IOHEXOL 300 MG/ML  SOLN: 100.0000 mL | Freq: Once | INTRAMUSCULAR | Status: DC | PRN

## 2021-12-13 ENCOUNTER — Other Ambulatory Visit: Payer: Self-pay

## 2021-12-13 ENCOUNTER — Ambulatory Visit (HOSPITAL_BASED_OUTPATIENT_CLINIC_OR_DEPARTMENT_OTHER)
Admission: RE | Admit: 2021-12-13 | Discharge: 2021-12-13 | Disposition: A | Discharge status: Home / Self Care | Payer: Medicare HMO | Source: Ambulatory Visit | Attending: Hematology & Oncology | Admitting: Hematology & Oncology

## 2021-12-13 ENCOUNTER — Encounter (HOSPITAL_BASED_OUTPATIENT_CLINIC_OR_DEPARTMENT_OTHER): Payer: Self-pay

## 2021-12-13 DIAGNOSIS — C50411 Malignant neoplasm of upper-outer quadrant of right female breast: Secondary | ICD-10-CM | POA: Insufficient documentation

## 2021-12-13 MED ORDER — IOHEXOL 300 MG/ML  SOLN
100.0000 mL | Freq: Once | INTRAMUSCULAR | Status: AC | PRN
  Administered 2021-12-13: 80 mL via INTRAVENOUS

## 2021-12-14 ENCOUNTER — Telehealth: Payer: Self-pay

## 2021-12-14 NOTE — Telephone Encounter (Signed)
The following message left on personalized VM per Dr Marin Olp:  ? ?"Please call and let her know that there is no evidence of cancer on the CT scan.  Thanks.."  ? ?Pt to contact office for questions or concerns. dph ?
